# Patient Record
Sex: Male | Born: 1955 | Race: White | Hispanic: No | Smoking: Never smoker
Health system: Southern US, Community
[De-identification: ages and names within clinical notes are randomized; demographics above are authoritative.]

## PROBLEM LIST (undated history)

## (undated) DIAGNOSIS — K766 Portal hypertension: Secondary | ICD-10-CM

## (undated) DIAGNOSIS — K5790 Diverticulosis of intestine, part unspecified, without perforation or abscess without bleeding: Secondary | ICD-10-CM

## (undated) DIAGNOSIS — F1010 Alcohol abuse, uncomplicated: Secondary | ICD-10-CM

## (undated) DIAGNOSIS — R161 Splenomegaly, not elsewhere classified: Secondary | ICD-10-CM

## (undated) DIAGNOSIS — D649 Anemia, unspecified: Secondary | ICD-10-CM

## (undated) DIAGNOSIS — E871 Hypo-osmolality and hyponatremia: Secondary | ICD-10-CM

## (undated) DIAGNOSIS — K760 Fatty (change of) liver, not elsewhere classified: Secondary | ICD-10-CM

## (undated) DIAGNOSIS — E876 Hypokalemia: Secondary | ICD-10-CM

## (undated) DIAGNOSIS — K4090 Unilateral inguinal hernia, without obstruction or gangrene, not specified as recurrent: Secondary | ICD-10-CM

## (undated) DIAGNOSIS — K7460 Unspecified cirrhosis of liver: Secondary | ICD-10-CM

## (undated) DIAGNOSIS — R17 Unspecified jaundice: Secondary | ICD-10-CM

## (undated) DIAGNOSIS — K8020 Calculus of gallbladder without cholecystitis without obstruction: Secondary | ICD-10-CM

## (undated) DIAGNOSIS — R188 Other ascites: Secondary | ICD-10-CM

## (undated) HISTORY — PX: HERNIA REPAIR: SHX51

---

## 2014-12-28 ENCOUNTER — Inpatient Hospital Stay (HOSPITAL_COMMUNITY)
Admission: EM | Admit: 2014-12-28 | Discharge: 2015-01-04 | DRG: 420 | Disposition: A | Discharge status: Hospice | Payer: Medicaid Other | Attending: Internal Medicine | Admitting: Internal Medicine

## 2014-12-28 ENCOUNTER — Emergency Department (HOSPITAL_COMMUNITY): Payer: Medicaid Other

## 2014-12-28 ENCOUNTER — Encounter (HOSPITAL_COMMUNITY): Payer: Self-pay

## 2014-12-28 DIAGNOSIS — K8020 Calculus of gallbladder without cholecystitis without obstruction: Secondary | ICD-10-CM | POA: Diagnosis present

## 2014-12-28 DIAGNOSIS — E876 Hypokalemia: Secondary | ICD-10-CM

## 2014-12-28 DIAGNOSIS — R161 Splenomegaly, not elsewhere classified: Secondary | ICD-10-CM

## 2014-12-28 DIAGNOSIS — K760 Fatty (change of) liver, not elsewhere classified: Secondary | ICD-10-CM | POA: Diagnosis present

## 2014-12-28 DIAGNOSIS — D689 Coagulation defect, unspecified: Secondary | ICD-10-CM | POA: Diagnosis present

## 2014-12-28 DIAGNOSIS — Z6825 Body mass index (BMI) 25.0-25.9, adult: Secondary | ICD-10-CM

## 2014-12-28 DIAGNOSIS — K766 Portal hypertension: Secondary | ICD-10-CM

## 2014-12-28 DIAGNOSIS — E43 Unspecified severe protein-calorie malnutrition: Secondary | ICD-10-CM | POA: Diagnosis present

## 2014-12-28 DIAGNOSIS — D72829 Elevated white blood cell count, unspecified: Secondary | ICD-10-CM | POA: Diagnosis present

## 2014-12-28 DIAGNOSIS — K7011 Alcoholic hepatitis with ascites: Secondary | ICD-10-CM | POA: Diagnosis present

## 2014-12-28 DIAGNOSIS — R17 Unspecified jaundice: Secondary | ICD-10-CM

## 2014-12-28 DIAGNOSIS — F419 Anxiety disorder, unspecified: Secondary | ICD-10-CM | POA: Diagnosis present

## 2014-12-28 DIAGNOSIS — T380X5A Adverse effect of glucocorticoids and synthetic analogues, initial encounter: Secondary | ICD-10-CM | POA: Diagnosis present

## 2014-12-28 DIAGNOSIS — D649 Anemia, unspecified: Secondary | ICD-10-CM | POA: Diagnosis present

## 2014-12-28 DIAGNOSIS — E871 Hypo-osmolality and hyponatremia: Secondary | ICD-10-CM

## 2014-12-28 DIAGNOSIS — Z66 Do not resuscitate: Secondary | ICD-10-CM | POA: Diagnosis present

## 2014-12-28 DIAGNOSIS — Z515 Encounter for palliative care: Secondary | ICD-10-CM

## 2014-12-28 DIAGNOSIS — K7460 Unspecified cirrhosis of liver: Secondary | ICD-10-CM | POA: Diagnosis present

## 2014-12-28 DIAGNOSIS — K4090 Unilateral inguinal hernia, without obstruction or gangrene, not specified as recurrent: Secondary | ICD-10-CM | POA: Insufficient documentation

## 2014-12-28 DIAGNOSIS — Z87891 Personal history of nicotine dependence: Secondary | ICD-10-CM | POA: Diagnosis not present

## 2014-12-28 DIAGNOSIS — F1010 Alcohol abuse, uncomplicated: Secondary | ICD-10-CM

## 2014-12-28 DIAGNOSIS — K4091 Unilateral inguinal hernia, without obstruction or gangrene, recurrent: Secondary | ICD-10-CM

## 2014-12-28 DIAGNOSIS — K7290 Hepatic failure, unspecified without coma: Secondary | ICD-10-CM | POA: Diagnosis present

## 2014-12-28 DIAGNOSIS — K7031 Alcoholic cirrhosis of liver with ascites: Secondary | ICD-10-CM

## 2014-12-28 DIAGNOSIS — D539 Nutritional anemia, unspecified: Secondary | ICD-10-CM | POA: Diagnosis present

## 2014-12-28 DIAGNOSIS — R748 Abnormal levels of other serum enzymes: Secondary | ICD-10-CM | POA: Diagnosis present

## 2014-12-28 DIAGNOSIS — K5790 Diverticulosis of intestine, part unspecified, without perforation or abscess without bleeding: Secondary | ICD-10-CM

## 2014-12-28 DIAGNOSIS — R188 Other ascites: Secondary | ICD-10-CM

## 2014-12-28 HISTORY — DX: Unilateral inguinal hernia, without obstruction or gangrene, not specified as recurrent: K40.90

## 2014-12-28 HISTORY — DX: Diverticulosis of intestine, part unspecified, without perforation or abscess without bleeding: K57.90

## 2014-12-28 HISTORY — DX: Splenomegaly, not elsewhere classified: R16.1

## 2014-12-28 HISTORY — DX: Unspecified cirrhosis of liver: K74.60

## 2014-12-28 HISTORY — DX: Unspecified jaundice: R17

## 2014-12-28 HISTORY — DX: Calculus of gallbladder without cholecystitis without obstruction: K80.20

## 2014-12-28 HISTORY — DX: Fatty (change of) liver, not elsewhere classified: K76.0

## 2014-12-28 HISTORY — DX: Other disorders of bilirubin metabolism: E80.6

## 2014-12-28 HISTORY — DX: Anemia, unspecified: D64.9

## 2014-12-28 HISTORY — DX: Portal hypertension: K76.6

## 2014-12-28 HISTORY — DX: Hypokalemia: E87.6

## 2014-12-28 HISTORY — DX: Alcohol abuse, uncomplicated: F10.10

## 2014-12-28 HISTORY — DX: Other ascites: R18.8

## 2014-12-28 HISTORY — DX: Hypo-osmolality and hyponatremia: E87.1

## 2014-12-28 LAB — URINALYSIS, ROUTINE W REFLEX MICROSCOPIC
Glucose, UA: 100 mg/dL — AB
Hgb urine dipstick: NEGATIVE
LEUKOCYTES UA: NEGATIVE
NITRITE: POSITIVE — AB
PH: 6.5 (ref 5.0–8.0)
Protein, ur: 30 mg/dL — AB
Specific Gravity, Urine: 1.01 (ref 1.005–1.030)

## 2014-12-28 LAB — HEPATITIS PANEL, ACUTE
HCV Ab: NEGATIVE
HEP B C IGM: NONREACTIVE
Hep A IgM: NONREACTIVE
Hepatitis B Surface Ag: NEGATIVE

## 2014-12-28 LAB — COMPREHENSIVE METABOLIC PANEL
ALBUMIN: 2.4 g/dL — AB (ref 3.5–5.2)
ALK PHOS: 156 U/L — AB (ref 39–117)
ALT: 31 U/L (ref 0–53)
ANION GAP: 14 (ref 5–15)
AST: 153 U/L — ABNORMAL HIGH (ref 0–37)
BILIRUBIN TOTAL: 25.8 mg/dL — AB (ref 0.3–1.2)
BUN: 25 mg/dL — ABNORMAL HIGH (ref 6–23)
CO2: 26 mmol/L (ref 19–32)
CREATININE: 0.8 mg/dL (ref 0.50–1.35)
Calcium: 8.2 mg/dL — ABNORMAL LOW (ref 8.4–10.5)
Chloride: 87 mmol/L — ABNORMAL LOW (ref 96–112)
GFR calc Af Amer: >90 mL/min (ref >90)
Glucose, Bld: 113 mg/dL — ABNORMAL HIGH (ref 70–99)
POTASSIUM: 2.7 mmol/L — AB (ref 3.5–5.1)
Sodium: 127 mmol/L — ABNORMAL LOW (ref 135–145)
Total Protein: 6.7 g/dL (ref 6.0–8.3)

## 2014-12-28 LAB — CBC WITH DIFFERENTIAL/PLATELET
BASOS ABS: 0 10*3/uL (ref 0.0–0.1)
Basophils Relative: 0 % (ref 0–1)
EOS ABS: 0 10*3/uL (ref 0.0–0.7)
Eosinophils Relative: 0 % (ref 0–5)
HEMATOCRIT: 30.4 % — AB (ref 39.0–52.0)
Hemoglobin: 10.7 g/dL — ABNORMAL LOW (ref 13.0–17.0)
Lymphocytes Relative: 8 % — ABNORMAL LOW (ref 12–46)
Lymphs Abs: 1 10*3/uL (ref 0.7–4.0)
MCH: 37.9 pg — ABNORMAL HIGH (ref 26.0–34.0)
MCHC: 35.2 g/dL (ref 30.0–36.0)
MCV: 107.8 fL — ABNORMAL HIGH (ref 78.0–100.0)
Monocytes Absolute: 1 10*3/uL (ref 0.1–1.0)
Monocytes Relative: 9 % (ref 3–12)
NEUTROS ABS: 9.7 10*3/uL — AB (ref 1.7–7.7)
NEUTROS PCT: 83 % — AB (ref 43–77)
Platelets: 209 10*3/uL (ref 150–400)
RBC: 2.82 MIL/uL — ABNORMAL LOW (ref 4.22–5.81)
RDW: 18.1 % — ABNORMAL HIGH (ref 11.5–15.5)
WBC: 11.7 10*3/uL — ABNORMAL HIGH (ref 4.0–10.5)

## 2014-12-28 LAB — AMMONIA
AMMONIA: 59 umol/L — AB (ref 11–32)
Ammonia: 65 umol/L — ABNORMAL HIGH (ref 11–32)

## 2014-12-28 LAB — MAGNESIUM: Magnesium: 1.7 mg/dL (ref 1.5–2.5)

## 2014-12-28 LAB — TSH: TSH: 1.136 u[IU]/mL (ref 0.350–4.500)

## 2014-12-28 LAB — RETICULOCYTES
RBC.: 2.48 MIL/uL — ABNORMAL LOW (ref 4.22–5.81)
Retic Count, Absolute: 81.8 10*3/uL (ref 19.0–186.0)
Retic Ct Pct: 3.3 % — ABNORMAL HIGH (ref 0.4–3.1)

## 2014-12-28 LAB — PROTIME-INR
INR: 1.54 — ABNORMAL HIGH (ref 0.00–1.49)
Prothrombin Time: 18.7 seconds — ABNORMAL HIGH (ref 11.6–15.2)

## 2014-12-28 LAB — URINE MICROSCOPIC-ADD ON

## 2014-12-28 LAB — ETHANOL: Alcohol, Ethyl (B): 129 mg/dL — ABNORMAL HIGH (ref 0–9)

## 2014-12-28 LAB — ACETAMINOPHEN LEVEL: Acetaminophen (Tylenol), Serum: <10 ug/mL — ABNORMAL LOW (ref 10–30)

## 2014-12-28 MED ORDER — ALUM & MAG HYDROXIDE-SIMETH 200-200-20 MG/5ML PO SUSP: 30.0000 mL | Freq: Four times a day (QID) | ORAL | Status: DC | PRN

## 2014-12-28 MED ORDER — PNEUMOCOCCAL VAC POLYVALENT 25 MCG/0.5ML IJ INJ
0.5000 mL | INJECTION | INTRAMUSCULAR | Status: AC
  Administered 2014-12-29: 0.5 mL via INTRAMUSCULAR
  Filled 2014-12-28: qty 0.5

## 2014-12-28 MED ORDER — MORPHINE SULFATE 2 MG/ML IJ SOLN
1.0000 mg | INTRAMUSCULAR | Status: DC | PRN
  Administered 2014-12-31 – 2015-01-04 (×7): 1 mg via INTRAVENOUS
  Filled 2014-12-28 (×7): qty 1

## 2014-12-28 MED ORDER — LACTULOSE 10 GM/15ML PO SOLN
30.0000 g | Freq: Once | ORAL | Status: AC
  Administered 2014-12-28: 30 g via ORAL
  Filled 2014-12-28: qty 60

## 2014-12-28 MED ORDER — ENSURE ENLIVE PO LIQD
237.0000 mL | Freq: Two times a day (BID) | ORAL | Status: DC
  Administered 2014-12-28 – 2015-01-04 (×3): 237 mL via ORAL

## 2014-12-28 MED ORDER — IOHEXOL 300 MG/ML  SOLN
100.0000 mL | Freq: Once | INTRAMUSCULAR | Status: AC | PRN
  Administered 2014-12-28: 100 mL via INTRAVENOUS

## 2014-12-28 MED ORDER — CETYLPYRIDINIUM CHLORIDE 0.05 % MT LIQD
7.0000 mL | Freq: Two times a day (BID) | OROMUCOSAL | Status: DC
  Administered 2014-12-28 – 2015-01-03 (×13): 7 mL via OROMUCOSAL

## 2014-12-28 MED ORDER — POTASSIUM CHLORIDE CRYS ER 20 MEQ PO TBCR
40.0000 meq | EXTENDED_RELEASE_TABLET | Freq: Once | ORAL | Status: AC
  Administered 2014-12-28: 40 meq via ORAL
  Filled 2014-12-28: qty 2

## 2014-12-28 MED ORDER — LORAZEPAM 1 MG PO TABS: 1.0000 mg | ORAL_TABLET | Freq: Four times a day (QID) | ORAL | Status: AC | PRN

## 2014-12-28 MED ORDER — LORAZEPAM 1 MG PO TABS
0.0000 mg | ORAL_TABLET | Freq: Four times a day (QID) | ORAL | Status: AC
  Administered 2014-12-28 – 2014-12-29 (×3): 2 mg via ORAL
  Filled 2014-12-28 (×3): qty 2

## 2014-12-28 MED ORDER — ONDANSETRON HCL 4 MG PO TABS: 4.0000 mg | ORAL_TABLET | Freq: Four times a day (QID) | ORAL | Status: DC | PRN

## 2014-12-28 MED ORDER — LORAZEPAM 1 MG PO TABS: 0.0000 mg | ORAL_TABLET | Freq: Two times a day (BID) | ORAL | Status: AC

## 2014-12-28 MED ORDER — SODIUM CHLORIDE 0.9 % IJ SOLN
3.0000 mL | Freq: Two times a day (BID) | INTRAMUSCULAR | Status: DC
  Administered 2014-12-28 – 2015-01-02 (×5): 3 mL via INTRAVENOUS

## 2014-12-28 MED ORDER — TRAZODONE HCL 50 MG PO TABS
25.0000 mg | ORAL_TABLET | Freq: Every evening | ORAL | Status: DC | PRN
Start: 2014-12-28 — End: 2015-01-04

## 2014-12-28 MED ORDER — ONDANSETRON HCL 4 MG/2ML IJ SOLN
4.0000 mg | Freq: Four times a day (QID) | INTRAMUSCULAR | Status: DC | PRN
  Administered 2015-01-02: 4 mg via INTRAVENOUS
  Filled 2014-12-28: qty 2

## 2014-12-28 MED ORDER — SODIUM CHLORIDE 0.9 % IJ SOLN
INTRAMUSCULAR | Status: AC
  Filled 2014-12-28: qty 1000

## 2014-12-28 MED ORDER — VITAMIN B-1 100 MG PO TABS
100.0000 mg | ORAL_TABLET | Freq: Every day | ORAL | Status: DC
  Administered 2014-12-28 – 2015-01-04 (×3): 100 mg via ORAL
  Filled 2014-12-28 (×3): qty 1

## 2014-12-28 MED ORDER — ADULT MULTIVITAMIN W/MINERALS CH
1.0000 | ORAL_TABLET | Freq: Every day | ORAL | Status: DC
  Administered 2014-12-28 – 2015-01-04 (×4): 1 via ORAL
  Filled 2014-12-28 (×4): qty 1

## 2014-12-28 MED ORDER — FOLIC ACID 1 MG PO TABS
1.0000 mg | ORAL_TABLET | Freq: Every day | ORAL | Status: DC
  Administered 2014-12-28 – 2015-01-04 (×4): 1 mg via ORAL
  Filled 2014-12-28 (×4): qty 1

## 2014-12-28 MED ORDER — THIAMINE HCL 100 MG/ML IJ SOLN
100.0000 mg | Freq: Every day | INTRAMUSCULAR | Status: DC
  Administered 2014-12-29 – 2015-01-03 (×5): 100 mg via INTRAVENOUS
  Filled 2014-12-28 (×5): qty 2

## 2014-12-28 MED ORDER — POTASSIUM CHLORIDE IN NACL 20-0.9 MEQ/L-% IV SOLN
INTRAVENOUS | Status: DC
  Administered 2014-12-28 – 2015-01-01 (×8): via INTRAVENOUS

## 2014-12-28 MED ORDER — LORAZEPAM 2 MG/ML IJ SOLN
1.0000 mg | Freq: Four times a day (QID) | INTRAMUSCULAR | Status: AC | PRN
  Administered 2014-12-28 – 2014-12-30 (×3): 1 mg via INTRAVENOUS
  Filled 2014-12-28 (×3): qty 1

## 2014-12-28 NOTE — ED Notes (Signed)
CRITICAL VALUE ALERT  Critical value received:  Potassium 2.7, Tot Bili 25.8  Date of notification:  12/28/14  Time of notification:  0845  Critical value read back:Yes.    Nurse who received alert:  GM  MD notified (1st page):  [phone]

## 2014-12-28 NOTE — ED Notes (Signed)
Pt lives with sister and she reports pt has beenjaundice since last week.   REPorts 3 weeks ago had n/v and cold symptoms.  Sister also reports confusion today.  Pt reports he drinks  " a moderate amount" daily.  Denies pain.  Reports doesn't have a  Librarian, academicDoctor.

## 2014-12-28 NOTE — H&P (Signed)
[facility] History and Physical  [name] UJW:[id number] DOB: 1955/11/04 DOA: 12/28/2014  Referring physician: Rubin Sloan PCP: No primary care provider on file.   Chief Complaint: jaundice  HPI: Franklin Sloan is a 59 y.o. male with a past medical history that includes EtOH abuse presents to the emergency department from home with the chief complaint of jaundice. Initial evaluation reveals, cirrhosis, cholelithiasis, hepatic steatosis, hyperbilirubinemia anemia, portal hypertension. Information is obtained from the patient. He states he lives with his sister she brought him to the emergency room today for a 2 day history of nausea and dry heaving and jaundice. Associated symptoms include abdominal pain described as intermittent sharp located in the upper right quadrant, decreased by mouth intake and decreased energy. Patient reports he drinks 1/2 of a fifth to a whole fifth of vodka daily for the last 2 years. He reports 4 years prior that he stopped drinking altogether. He reports not noticing his own yellow color. He denies fever chills chest pain palpitations shortness of breath cough headache visual disturbances syncope or near-syncope. He denies dysuria hematuria frequency or urgency he denies diarrhea constipation melena. Workup in the emergency department significant for WBCs 11.7 hemoglobin 10.7 sodium 127 potassium 2.7 chloride 87 BUN 25 INR 1.5 for serum glucose 113. Abdominal ultrasound reveals gallbladder distended with possible sludge and wall thickening, CT of the abdomen pelvis reveals hepatic steatosis and cirrhosis with portal venous hypertension including splenomegaly varices and ascites, not really distended gallbladder with cholelithiasis but no biliary ductal dilation, small and large bowel wall thickening, pulmonary atelectasis. In the emergency department he is afebrile hemodynamically stable and not hypoxic Review of Systems:  10 point review of systems completed and  all systems are negative except as indicated in the history of present illness  Past Medical History  Diagnosis Date  . Cirrhosis   . Hepatic steatosis   . Spleen enlarged   . Jaundice   . Hyperbilirubinemia   . Portal hypertension   . ETOH abuse   . Cholelithiasis   . Anemia   . Hypokalemia   . Hyponatremia   . Ascites   . Diverticulosis   . Inguinal hernia    Past Surgical History  Procedure Laterality Date  . Hernia repair     Social History:  reports that he has never smoked. He does not have any smokeless tobacco history on file. He reports that he drinks alcohol. He reports that he does not use illicit drugs. 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 patient unable to recall family medical history at this time  Prior to Admission medications   Medication Sig Start Date End Date Taking? Authorizing Provider  dextromethorphan-guaiFENesin (ROBITUSSIN-DM) 10-100 MG/5ML liquid Take 30 mLs by mouth every 4 (four) hours as needed for cough.   Yes Historical Provider, MD  Pseudoeph-Doxylamine-DM-APAP (NYQUIL MULTI-SYMPTOM PO) Take 30 mLs by mouth daily.   Yes Historical Provider, MD   Physical Exam: Filed Vitals:   12/28/14 1100 12/28/14 1114 12/28/14 1130 12/28/14 1200  BP:  107/70 109/67 106/53  Pulse: 97 95    Temp:      TempSrc:      Resp:  18    Height:      Weight:      SpO2: 91% 91%  98%    Wt Readings from Last 3 Encounters:  12/28/14 88.451 kg (195  lb)    General:  Appears calm and comfortable Eyes: PERRL, normal lids, lateral scleral icterus ENT: grossly normal hearing, lips & tongue because membranes of his mouth are soft pink slightly dry Neck: no LAD, masses or thyromegaly Cardiovascular: RRR, no m/r/g. No LE edema. Respiratory: CTA bilaterally, no w/r/r. Normal respiratory effort. Abdomen: Distended sluggish bowel sounds nontender to  palpation palpable hepatomegaly Skin: Jaundice Musculoskeletal: grossly normal tone BUE/BLE Psychiatric: grossly normal mood and affect, speech fluent and appropriate Neurologic: grossly non-focal. Speech slow and deliberate but clear           Labs on Admission:  Basic Metabolic Panel:  Recent Labs Lab 12/28/14 0800  NA 127*  K 2.7*  CL 87*  CO2 26  GLUCOSE 113*  BUN 25*  CREATININE 0.80  CALCIUM 8.2*   Liver Function Tests:  Recent Labs Lab 12/28/14 0800  AST 153*  ALT 31  ALKPHOS 156*  BILITOT 25.8*  PROT 6.7  ALBUMIN 2.4*   No results for input(s): LIPASE, AMYLASE in the last 168 hours.  Recent Labs Lab 12/28/14 0800  AMMONIA 59*   CBC:  Recent Labs Lab 12/28/14 0800  WBC 11.7*  NEUTROABS 9.7*  HGB 10.7*  HCT 30.4*  MCV 107.8*  PLT 209   Cardiac Enzymes: No results for input(s): CKTOTAL, CKMB, CKMBINDEX, TROPONINI in the last 168 hours.  BNP (last 3 results) No results for input(s): BNP in the last 8760 hours.  ProBNP (last 3 results) No results for input(s): PROBNP in the last 8760 hours.  CBG: No results for input(s): GLUCAP in the last 168 hours.  Radiological Exams on Admission: Dg Chest 2 View  12/28/2014   CLINICAL DATA:  59 year old male with vomiting and cough for 1 week. Jaundice. Initial encounter.  EXAM: CHEST  2 VIEW  COMPARISON:  None.  FINDINGS: Low lung volumes. Mild crowding of markings at both lung bases. Normal cardiac size and mediastinal contours. Visualized tracheal air column is within normal limits. No pneumothorax, pulmonary edema or pleural effusion. No acute osseous abnormality identified.  IMPRESSION: Low lung volumes, otherwise no acute cardiopulmonary abnormality.   Electronically Signed   By: Odessa FlemingH  Hall M.D.   On: 12/28/2014 09:35   Ct Abdomen Pelvis W Contrast  12/28/2014   CLINICAL DATA:  59 year old male with new onset jaundice since last week. Nausea vomiting and cold like illness symptoms for 3 weeks. Confusion  today. Initial encounter.  EXAM: CT ABDOMEN AND PELVIS WITH CONTRAST  TECHNIQUE: Multidetector CT imaging of the abdomen and pelvis was performed using the standard protocol following bolus administration of intravenous contrast.  CONTRAST:  100mL OMNIPAQUE IOHEXOL 300 MG/ML  SOLN  COMPARISON:  Abdomen ultrasound 0857 hours today. Chest radiographs 0918 hours today.  FINDINGS: Atelectasis at both lung bases. Mostly calcified granuloma in the right costophrenic angle. No pericardial or pleural effusion.  No acute osseous abnormality identified.  Small to moderate volume of pelvic ascites. Small fat containing inguinal hernia with trace fluid.  Space of Retzius varices. Otherwise negative bladder. Negative distal colon.  Sigmoid and left colon diverticulosis. Mild wall thickening of the 8 transverse colon and right colon probably is reactive. Normal appendix. No dilated small bowel, but there are thickened loops of small bowel in the left abdomen (series 2, image 73). Negative stomach. Mild duodenum all thickening and small second portion duodenum diverticulum on series 2, image 47.  Splenomegaly, splenic volume calculated at 1,206 mL (normal splenic volume range 83 - 412 mL). Small to  medium caliber varices at the root of the mesentery and tracking within the small bowel mesentery. Recannulized paraumbilical vein and small caliber are ventral abdominal wall varices. Small volume abdominal ascites.  Negative pancreas and adrenal glands. Renal enhancement and contrast excretion within normal limits. Aortoiliac calcified atherosclerosis noted. Major arterial structures are patent.  Portal venous system is patent. Moderately decreased density throughout the liver with irregular liver contour. No discrete liver mass. Moderately distended gallbladder with dependent gallstones. No dilatation of the CBD. No dilated intrahepatic biliary tree.  No lymphadenopathy. Small volume of ascites along the distal thoracic esophagus.   IMPRESSION: 1. Hepatic steatosis and cirrhosis with portal venous hypertension including splenomegaly, varices, ascites. No discrete liver mass is identified. 2. Moderately distended gallbladder with cholelithiasis, but no intra- or extrahepatic biliary ductal dilatation. 3. Reactive appearing small and large bowel wall thickening. 4. Pulmonary atelectasis.   Electronically Signed   By: Odessa Fleming M.D.   On: 12/28/2014 11:23   US Abdomen Limited  12/28/2014   CLINICAL DATA:  Jaundice, right upper quadrant discomfort for the past 3 weeks; history of alcohol abuse, nausea, vomiting, and confusion.  EXAM: US ABDOMEN LIMITED - RIGHT UPPER QUADRANT  COMPARISON:  None  FINDINGS: Gallbladder:  The gallbladder is distended and contains echogenic material likely inspissated bile or sludge. The gallbladder wall is thickened to 7 mm. There is no intramural fluid or positive sonographic Murphy's sign. There is adjacent ascites.  Common bile duct:  Diameter: 5 mm  Liver:  There is marked attenuation of the ultrasound beam by the hepatic echotexture. There is no discrete mass or ductal dilation but evaluation is limited. No focal lesion identified. Within normal limits in parenchymal echogenicity.  IMPRESSION: 1. The gallbladder is mildly distended and the lumen largely filled with echogenic material which may reflect sludge or echogenic bile. There is gallbladder wall thickening. No discrete gallbladder mass is demonstrated but an intraluminal mass could mimic the findings here. 2. Increased hepatic echotexture with poor definition of the parenchymal architecture. 3. CT scanning of the abdomen is recommended for further evaluation of the liver, gallbladder, and pancreas.   Electronically Signed   By: David  [place] M.D.   On: 12/28/2014 09:20    EKG: Reviewed. Sinus rhythm with prolonged QT  Assessment/Plan Principal Problem:   Cirrhosis; likely related to EtOH abuse. Confirmed with abdominal CT. Total bilirubin 25. Will  admit to telemetry. Ammonia level slightly elevated will give 1 dose of lactulose. I'll request gastroenterology assistance. Active Problems:   ETOH abuse: No signs of withdrawal at this time. Will implement CIWA protocol. Will check folate and B-12.  TSH   Cholelithiasis; will request gastroenterology consult. Don't feel general surgery consult needed at this point.    Ascites: Small amount per CT. Related to #1. Monitor    Hyponatremia: Clearly related to #1 in the setting of decreased by mouth intake nausea and vomiting. Will provide gentle IV fluids. Recheck in the morning    Hypokalemia: Related to #1 in the setting of nausea vomiting and decreased by mouth intake. Will replete and recheck. Will obtain a magnesium level as well    Anemia: Likely related to #2. Will obtain an anemia panel. Check FOBT     Hyperbilirubinemia: See #1    Portal hypertension: See #1    Elevated alkaline phosphatase level: see #1. Will monitor   gastroenterology  Code Status: full DVT Prophylaxis: Family Communication: sister on phone  Disposition Plan: home when ready  Time spent:  75 minutes  [facility] Pager [phone]

## 2014-12-28 NOTE — ED Provider Notes (Signed)
CSN: [id number]     Arrival date & time 12/28/14  0740 History   First MD Initiated Contact with Patient 12/28/14 [phone]     Chief Complaint  Patient presents with  . Jaundice     (Consider location/radiation/quality/duration/timing/severity/associated sxs/prior Treatment) The history is provided by the patient.   patient presents with jaundice. Reportedly has been jaundiced for about a week. Patient states he did not notice it. Patient's sister reportedly noticed and told him he had to come to the hospital. About 3 weeks ago he had some nausea and vomiting with abdominal pain. Now he states he has a little bit of a cough. Minimal sputum production. No fevers. States he drinks about two 5 ounce vodkas a day. Denies blood transfusion. No bleeding. No chills. No noticeable weight loss. Patient denies confusion but reportedly patient's sister states that he has had the change this week Past Medical History  Diagnosis Date  . Cirrhosis   . Hepatic steatosis   . Spleen enlarged   . Jaundice   . Hyperbilirubinemia   . Portal hypertension   . ETOH abuse   . Cholelithiasis   . Anemia   . Hypokalemia   . Hyponatremia   . Ascites   . Diverticulosis   . Inguinal hernia    Past Surgical History  Procedure Laterality Date  . Hernia repair     No family history on file. History  Substance Use Topics  . Smoking status: Never Smoker   . Smokeless tobacco: Not on file  . Alcohol Use: Yes     Comment: daily- moderate amount    Review of Systems  Constitutional: Positive for appetite change and fatigue. Negative for unexpected weight change.  Respiratory: Positive for cough. Negative for shortness of breath.   Cardiovascular: Negative for chest pain.  Gastrointestinal: Negative for abdominal pain and abdominal distention.  Genitourinary: Negative for flank pain.  Musculoskeletal: Negative for back pain.  Skin: Positive for color change.  Neurological: Negative for speech difficulty.       Allergies  Review of patient's allergies indicates no known allergies.  Home Medications   Prior to Admission medications   Medication Sig Start Date End Date Taking? Authorizing Provider  dextromethorphan-guaiFENesin (ROBITUSSIN-DM) 10-100 MG/5ML liquid Take 30 mLs by mouth every 4 (four) hours as needed for cough.   Yes Historical Provider, MD  Pseudoeph-Doxylamine-DM-APAP (NYQUIL MULTI-SYMPTOM PO) Take 30 mLs by mouth daily.   Yes Historical Provider, MD   BP 124/72 mmHg  Pulse 95  Temp(Src) 97.7 F (36.5 C) (Oral)  Resp 18  Ht 5\' 11"  (1.803 m)  Wt 190 lb 8 oz (86.41 kg)  BMI 26.58 kg/m2  SpO2 94% Physical Exam  Constitutional: He appears well-developed and well-nourished.  HENT:  Head: Atraumatic.  Eyes: Scleral icterus is present.  Cardiovascular: Regular rhythm.   Pulmonary/Chest: Effort normal and breath sounds normal.  Abdominal: He exhibits mass.  Possible mild ascites. Palpable hepatomegaly. No tenderness.  Musculoskeletal: Normal range of motion.  Neurological: He is alert.  Skin: Skin is warm.  Patient is diffusely jaundiced    ED Course  Procedures (including critical care time) Labs Review Labs Reviewed  CBC WITH DIFFERENTIAL/PLATELET - Abnormal; Notable for the following:    WBC 11.7 (*)    RBC 2.82 (*)    Hemoglobin 10.7 (*)    HCT 30.4 (*)    MCV 107.8 (*)    MCH 37.9 (*)    RDW 18.1 (*)    Neutrophils Relative %  83 (*)    Neutro Abs 9.7 (*)    Lymphocytes Relative 8 (*)    All other components within normal limits  COMPREHENSIVE METABOLIC PANEL - Abnormal; Notable for the following:    Sodium 127 (*)    Potassium 2.7 (*)    Chloride 87 (*)    Glucose, Bld 113 (*)    BUN 25 (*)    Calcium 8.2 (*)    Albumin 2.4 (*)    AST 153 (*)    Alkaline Phosphatase 156 (*)    Total Bilirubin 25.8 (*)    All other components within normal limits  URINALYSIS, ROUTINE W REFLEX MICROSCOPIC - Abnormal; Notable for the following:    Color, Urine  BROWN (*)    APPearance TURBID (*)    Glucose, UA 100 (*)    Bilirubin Urine LARGE (*)    Ketones, ur TRACE (*)    Protein, ur 30 (*)    Urobilinogen, UA >8.0 (*)    Nitrite POSITIVE (*)    All other components within normal limits  PROTIME-INR - Abnormal; Notable for the following:    Prothrombin Time 18.7 (*)    INR 1.54 (*)    All other components within normal limits  AMMONIA - Abnormal; Notable for the following:    Ammonia 59 (*)    All other components within normal limits  ETHANOL - Abnormal; Notable for the following:    Alcohol, Ethyl (B) 129 (*)    All other components within normal limits  AMMONIA - Abnormal; Notable for the following:    Ammonia 65 (*)    All other components within normal limits  RETICULOCYTES - Abnormal; Notable for the following:    Retic Ct Pct 3.3 (*)    RBC. 2.48 (*)    All other components within normal limits  ACETAMINOPHEN LEVEL - Abnormal; Notable for the following:    Acetaminophen (Tylenol), Serum <10.0 (*)    All other components within normal limits  URINE MICROSCOPIC-ADD ON  MAGNESIUM  HEPATITIS PANEL, ACUTE  OCCULT BLOOD X 1 CARD TO LAB, STOOL  VITAMIN B12  FOLATE  IRON AND TIBC  FERRITIN  URINE RAPID DRUG SCREEN (HOSP PERFORMED)  TSH    Imaging Review Dg Chest 2 View  12/28/2014   CLINICAL DATA:  59 year old male with vomiting and cough for 1 week. Jaundice. Initial encounter.  EXAM: CHEST  2 VIEW  COMPARISON:  None.  FINDINGS: Low lung volumes. Mild crowding of markings at both lung bases. Normal cardiac size and mediastinal contours. Visualized tracheal air column is within normal limits. No pneumothorax, pulmonary edema or pleural effusion. No acute osseous abnormality identified.  IMPRESSION: Low lung volumes, otherwise no acute cardiopulmonary abnormality.   Electronically Signed   By: Odessa Fleming M.D.   On: 12/28/2014 09:35   Ct Abdomen Pelvis W Contrast  12/28/2014   CLINICAL DATA:  59 year old male with new onset  jaundice since last week. Nausea vomiting and cold like illness symptoms for 3 weeks. Confusion today. Initial encounter.  EXAM: CT ABDOMEN AND PELVIS WITH CONTRAST  TECHNIQUE: Multidetector CT imaging of the abdomen and pelvis was performed using the standard protocol following bolus administration of intravenous contrast.  CONTRAST:  OMNIPAQUE IOHEXOL 300 MG/ML  SOLN  COMPARISON:  Abdomen ultrasound 0857 hours today. Chest radiographs 0918 hours today.  FINDINGS: Atelectasis at both lung bases. Mostly calcified granuloma in the right costophrenic angle. No pericardial or pleural effusion.  No acute osseous abnormality identified.  Small to moderate volume of pelvic ascites. Small fat containing inguinal hernia with trace fluid.  Space of Retzius varices. Otherwise negative bladder. Negative distal colon.  Sigmoid and left colon diverticulosis. Mild wall thickening of the 8 transverse colon and right colon probably is reactive. Normal appendix. No dilated small bowel, but there are thickened loops of small bowel in the left abdomen (series 2, image 73). Negative stomach. Mild duodenum all thickening and small second portion duodenum diverticulum on series 2, image 47.  Splenomegaly, splenic volume calculated at 1,206 mL (normal splenic volume range 83 - 412 mL). Small to medium caliber varices at the root of the mesentery and tracking within the small bowel mesentery. Recannulized paraumbilical vein and small caliber are ventral abdominal wall varices. Small volume abdominal ascites.  Negative pancreas and adrenal glands. Renal enhancement and contrast excretion within normal limits. Aortoiliac calcified atherosclerosis noted. Major arterial structures are patent.  Portal venous system is patent. Moderately decreased density throughout the liver with irregular liver contour. No discrete liver mass. Moderately distended gallbladder with dependent gallstones. No dilatation of the CBD. No dilated intrahepatic  biliary tree.  No lymphadenopathy. Small volume of ascites along the distal thoracic esophagus.  IMPRESSION: 1. Hepatic steatosis and cirrhosis with portal venous hypertension including splenomegaly, varices, ascites. No discrete liver mass is identified. 2. Moderately distended gallbladder with cholelithiasis, but no intra- or extrahepatic biliary ductal dilatation. 3. Reactive appearing small and large bowel wall thickening. 4. Pulmonary atelectasis.   Electronically Signed   By: Odessa Fleming M.D.   On: 12/28/2014 11:23   US Abdomen Limited  12/28/2014   CLINICAL DATA:  Jaundice, right upper quadrant discomfort for the past 3 weeks; history of alcohol abuse, nausea, vomiting, and confusion.  EXAM: US ABDOMEN LIMITED - RIGHT UPPER QUADRANT  COMPARISON:  None  FINDINGS: Gallbladder:  The gallbladder is distended and contains echogenic material likely inspissated bile or sludge. The gallbladder wall is thickened to 7 mm. There is no intramural fluid or positive sonographic Murphy's sign. There is adjacent ascites.  Common bile duct:  Diameter: 5 mm  Liver:  There is marked attenuation of the ultrasound beam by the hepatic echotexture. There is no discrete mass or ductal dilation but evaluation is limited. No focal lesion identified. Within normal limits in parenchymal echogenicity.  IMPRESSION: 1. The gallbladder is mildly distended and the lumen largely filled with echogenic material which may reflect sludge or echogenic bile. There is gallbladder wall thickening. No discrete gallbladder mass is demonstrated but an intraluminal mass could mimic the findings here. 2. Increased hepatic echotexture with poor definition of the parenchymal architecture. 3. CT scanning of the abdomen is recommended for further evaluation of the liver, gallbladder, and pancreas.   Electronically Signed   By: David  [place] M.D.   On: 12/28/2014 09:20     EKG Interpretation   Date/Time:  Wednesday December 28 2014 08:06:17 EDT Ventricular  Rate:  99 PR Interval:  174 QRS Duration: 84 QT Interval:  392 QTC Calculation: 503 R Axis:   -5 Text Interpretation:  Sinus rhythm Low voltage, precordial leads  Borderline abnrm T, anterolateral leads Prolonged QT interval Confirmed by  Rubin Payor  MD, Jennilee Demarco [id number]) on 12/28/2014 8:09:46 AM      MDM   Final diagnoses:  Cirrhosis of liver with ascites, unspecified hepatic cirrhosis type  Jaundice    Patient with cirrhosis and ascites. Has jaundice. Could be related alcohol but also some abnormal findings on the gallbladder. Does not have  primary care follow-up. Will admit to internal medicine.    Benjiman Core, MD 12/28/14 [phone]

## 2014-12-28 NOTE — ED Notes (Signed)
Pt's skin jaundiced upon observation, sclera of eyes bright yellow, pt alert but orientation is questionable at times per pt's sister, abdomen has slight distention without pain on palpation.

## 2014-12-29 ENCOUNTER — Encounter (HOSPITAL_COMMUNITY): Payer: Self-pay | Admitting: Gastroenterology

## 2014-12-29 DIAGNOSIS — F1010 Alcohol abuse, uncomplicated: Secondary | ICD-10-CM

## 2014-12-29 DIAGNOSIS — R17 Unspecified jaundice: Secondary | ICD-10-CM

## 2014-12-29 DIAGNOSIS — K7031 Alcoholic cirrhosis of liver with ascites: Secondary | ICD-10-CM

## 2014-12-29 DIAGNOSIS — D649 Anemia, unspecified: Secondary | ICD-10-CM

## 2014-12-29 LAB — COMPREHENSIVE METABOLIC PANEL
ALBUMIN: 2 g/dL — AB (ref 3.5–5.2)
ALT: 28 U/L (ref 0–53)
AST: 156 U/L — AB (ref 0–37)
Alkaline Phosphatase: 136 U/L — ABNORMAL HIGH (ref 39–117)
Anion gap: 9 (ref 5–15)
BILIRUBIN TOTAL: 25.5 mg/dL — AB (ref 0.3–1.2)
BUN: 23 mg/dL (ref 6–23)
CALCIUM: 8 mg/dL — AB (ref 8.4–10.5)
CHLORIDE: 94 mmol/L — AB (ref 96–112)
CO2: 28 mmol/L (ref 19–32)
Creatinine, Ser: 0.42 mg/dL — ABNORMAL LOW (ref 0.50–1.35)
GFR calc Af Amer: >90 mL/min (ref >90)
GFR calc non Af Amer: >90 mL/min (ref >90)
Glucose, Bld: 94 mg/dL (ref 70–99)
Potassium: 2.8 mmol/L — ABNORMAL LOW (ref 3.5–5.1)
Sodium: 131 mmol/L — ABNORMAL LOW (ref 135–145)
Total Protein: 5.7 g/dL — ABNORMAL LOW (ref 6.0–8.3)

## 2014-12-29 LAB — PROTIME-INR
INR: 1.82 — ABNORMAL HIGH (ref 0.00–1.49)
Prothrombin Time: 21.2 seconds — ABNORMAL HIGH (ref 11.6–15.2)

## 2014-12-29 LAB — FOLATE: Folate: 0.8 ng/mL — ABNORMAL LOW

## 2014-12-29 LAB — CBC
HCT: 27 % — ABNORMAL LOW (ref 39.0–52.0)
Hemoglobin: 9.5 g/dL — ABNORMAL LOW (ref 13.0–17.0)
MCH: 38.3 pg — ABNORMAL HIGH (ref 26.0–34.0)
MCHC: 35.2 g/dL (ref 30.0–36.0)
MCV: 108.9 fL — ABNORMAL HIGH (ref 78.0–100.0)
Platelets: 168 10*3/uL (ref 150–400)
RBC: 2.48 MIL/uL — AB (ref 4.22–5.81)
RDW: 18.2 % — AB (ref 11.5–15.5)
WBC: 8.9 10*3/uL (ref 4.0–10.5)

## 2014-12-29 LAB — IRON AND TIBC: IRON: 92 ug/dL (ref 42–165)

## 2014-12-29 LAB — RAPID URINE DRUG SCREEN, HOSP PERFORMED
AMPHETAMINES: NOT DETECTED
BARBITURATES: NOT DETECTED
Benzodiazepines: NOT DETECTED
COCAINE: NOT DETECTED
Opiates: POSITIVE — AB
TETRAHYDROCANNABINOL: NOT DETECTED

## 2014-12-29 LAB — AMMONIA: AMMONIA: 64 umol/L — AB (ref 11–32)

## 2014-12-29 LAB — VITAMIN B12: Vitamin B-12: 805 pg/mL (ref 211–911)

## 2014-12-29 LAB — FERRITIN: Ferritin: 2258 ng/mL — ABNORMAL HIGH (ref 22–322)

## 2014-12-29 MED ORDER — MAGNESIUM SULFATE 2 GM/50ML IV SOLN
2.0000 g | Freq: Once | INTRAVENOUS | Status: AC
  Administered 2014-12-29: 2 g via INTRAVENOUS
  Filled 2014-12-29: qty 50

## 2014-12-29 MED ORDER — LACTULOSE ENEMA
300.0000 mL | Freq: Two times a day (BID) | ORAL | Status: DC
  Administered 2014-12-29 – 2015-01-02 (×10): 300 mL via RECTAL
  Filled 2014-12-29 (×14): qty 300

## 2014-12-29 MED ORDER — VITAMIN K1 10 MG/ML IJ SOLN
10.0000 mg | Freq: Once | INTRAMUSCULAR | Status: AC
  Administered 2014-12-29: 10 mg via SUBCUTANEOUS
  Filled 2014-12-29: qty 1

## 2014-12-29 MED ORDER — LORAZEPAM 2 MG/ML IJ SOLN
1.0000 mg | INTRAMUSCULAR | Status: DC
  Administered 2014-12-29 – 2014-12-30 (×5): 1 mg via INTRAVENOUS
  Filled 2014-12-29 (×5): qty 1

## 2014-12-29 MED ORDER — POTASSIUM CHLORIDE 10 MEQ/100ML IV SOLN
10.0000 meq | INTRAVENOUS | Status: AC
  Administered 2014-12-29 (×4): 10 meq via INTRAVENOUS
  Filled 2014-12-29: qty 100

## 2014-12-29 MED ORDER — PANTOPRAZOLE SODIUM 40 MG IV SOLR
40.0000 mg | INTRAVENOUS | Status: DC
  Administered 2014-12-29 – 2015-01-04 (×7): 40 mg via INTRAVENOUS
  Filled 2014-12-29 (×7): qty 40

## 2014-12-29 NOTE — Progress Notes (Signed)
INITIAL NUTRITION ASSESSMENT   INTERVENTION: -Agree with Ensure Enlive BID when pt diet is advanced to full liquids  -Add ProStat 30 ml TID (each 30 ml provides 100 kcal, 15 gr protein)   -RD will continue to follow  NUTRITION DIAGNOSIS: Inadequate oral intake related to cirrhosis as evidenced by NPO status  Goal: Pt to progress diet to meet >/= 90% of their estimated nutrition needs    Monitor:  Diet advancement and tolerance-lab results   Reason for Assessment: Malnutrition Screen   59 y.o. male  Admitting Dx: Cirrhosis  ASSESSMENT: Pt presents with jaundice. Hx of ETOH abuse and decreased oral intake prior to admission. He is not alert enough at this time to provide hx. CT with evidence of cirrhosis, splenomegaly, varices, and ascites (perihepatic). Labs: sodium-131 and  potassium 2.8- low Nutrition focused exam: deferred   Height: Ht Readings from Last 1 Encounters:  12/28/14 5\' 11"  (1.803 m)    Weight: Wt Readings from Last 1 Encounters:  12/29/14 182 lb 3.2 oz (82.645 kg)    Ideal Body Weight: 172# (78 kg)  % Ideal Body Weight: 106%  Wt Readings from Last 10 Encounters:  12/29/14 182 lb 3.2 oz (82.645 kg)    Usual Body Weight: unknown  BMI:  Body mass index is 25.42 kg/(m^2). overweight  Estimated Nutritional Needs: Kcal: [id number] Protein: 107-123 gr Fluid: 2.0-2.3 liters daily  Skin: jaundice   Diet Order: Diet NPO time specified  EDUCATION NEEDS: -No education needs identified at this time   Intake/Output Summary (Last 24 hours) at 12/29/14 1535 Last data filed at 12/29/14 0900  Gross per 24 hour  Intake      0 ml  Output      0 ml  Net      0 ml    Last BM: 12/28/14 -abdomen distended and pt is receiving lactulose enemas  Labs:   Recent Labs Lab 12/28/14 0800 12/28/14 1414 12/29/14 0613  NA 127*  --  131*  K 2.7*  --  2.8*  CL 87*  --  94*  CO2 26  --  28  BUN 25*  --  23  CREATININE 0.80  --  0.42*  CALCIUM 8.2*  --   8.0*  MG  --  1.7  --   GLUCOSE 113*  --  94    CBG (last 3)  No results for input(s): GLUCAP in the last 72 hours.  Scheduled Meds: . antiseptic oral rinse  7 mL Mouth Rinse BID  . feeding supplement (ENSURE ENLIVE)  237 mL Oral BID BM  . folic acid  1 mg Oral Daily  . lactulose  300 mL Rectal BID  . LORazepam  1 mg Intravenous Q4H  . LORazepam  0-4 mg Oral Q6H   Followed by  . [START ON 12/30/2014] LORazepam  0-4 mg Oral Q12H  . multivitamin with minerals  1 tablet Oral Daily  . pantoprazole (PROTONIX) IV  40 mg Intravenous Q24H  . potassium chloride  10 mEq Intravenous Q1 Hr x 4  . sodium chloride  3 mL Intravenous Q12H  . thiamine  100 mg Oral Daily   Or  . thiamine  100 mg Intravenous Daily    Continuous Infusions: . 0.9 % NaCl with KCl 20 mEq / L 75 mL/hr at 12/29/14 0240    Past Medical History  Diagnosis Date  . Cirrhosis   . Hepatic steatosis   . Spleen enlarged   . Jaundice   . Hyperbilirubinemia   .  Portal hypertension   . ETOH abuse   . Cholelithiasis   . Anemia   . Hypokalemia   . Hyponatremia   . Ascites   . Diverticulosis   . Inguinal hernia     Past Surgical History  Procedure Laterality Date  . Hernia repair      Franklin Shivers MS,RD,CSG,LDN Office: [phone] Pager: [phone]

## 2014-12-29 NOTE — Progress Notes (Signed)
Patient bed alarm going off at 2100.  Patient is attempting to get out of bed without assistance.  Patient assisted to bedside commode and was noted to have an unsteady gait.  While sitting on the bedside commode it was noted to have tremors, visual hallucinations and confusion.  Patient assisted back to bed and CIWA performed which gave him a score of 17.  Bed alarm was engaged and while I was attempting to get his ativan his bed alarm was going off again.  Patient was attempting to get out of bed stating it was time for him to get dressed.  I attempted to reorient the patient and was unsuccessful however he did allow me to assist him back to bed.  A nurse Control and instrumentation engineertech safety sitter was initiated for patient safety to prevent injury.  Nursing staff to continue to monitor.

## 2014-12-29 NOTE — Care Management Utilization Note (Signed)
UR completed 

## 2014-12-29 NOTE — Consult Note (Addendum)
Referring Provider: [name], NP Primary Care Physician:  No primary care provider on file. Primary Gastroenterologist:  Dr. Jena Gauss   Date of Admission: 12/28/14 Date of Consultation: 12/29/14  Reason for Consultation:  Elevated LFTs   HPI:  Franklin Sloan is a 59 year old male with history of ETOH abuse, presenting to the ED from home with complaints of jaundice. At time of consultation, he does not arouse to verbal stimuli. Moans with sternal rub and painful stimuli. Discussed with nursing staff, who state he received Ativan overnight. Per medication administration record, he last receive Ativan 2 mg at 0240. All information gathered from medical record, as patient was not able to assist in history.   Per documentation, patient lives with his sister and was taken to the ED after 2 day history of N/V and noted to be jaundice. Associated symptoms per documentation included RUQ pain, decrease oral intake, and fatigue. Patient is documented as drinking 12/ of a fifth to a whole fifth of vodka daily for the last 2 years. No documentation of fever or chills, overt GI bleeding. US abdomen with gallbladder wall thickening, mild distension of gallbladder with sludge or bile. CT with evidence of cirrhosis, splenomegaly, varices, and ascites. Moderately distended gallbladder with gallstones but NO ductal dilatation. Reactive small and large bowel wall thickening. Mild leukocytosis on admission of 11.7, now resolved today. Afebrile. Discussed with Dr. Tyron Russell regarding amount of ascites seen on [place] and CT: no large volume ascites present but perihepatic ascites noted. If fluid analysis needed, could obtain small syringe size of fluid, as there is not a significant amount present. Alcohol level 129 on admission. Viral markers negative.   Spoke with Olene Floss via cell phone (sister) for more information. States 3 weeks ago acute onset of N/V that lasted 2-3 days. Jaundice started around this time. Blood in  urine, no overt signs of GI bleeding. No prior colonoscopy or EGD.   Discriminant function: 41-58 with PT controls. MELD 24.   Past Medical History  Diagnosis Date  . Cirrhosis   . Hepatic steatosis   . Spleen enlarged   . Jaundice   . Hyperbilirubinemia   . Portal hypertension   . ETOH abuse   . Cholelithiasis   . Anemia   . Hypokalemia   . Hyponatremia   . Ascites   . Diverticulosis   . Inguinal hernia     Past Surgical History  Procedure Laterality Date  . Hernia repair      Prior to Admission medications   Medication Sig Start Date End Date Taking? Authorizing Provider  dextromethorphan-guaiFENesin (ROBITUSSIN-DM) 10-100 MG/5ML liquid Take 30 mLs by mouth every 4 (four) hours as needed for cough.   Yes Historical Provider, MD  Pseudoeph-Doxylamine-DM-APAP (NYQUIL MULTI-SYMPTOM PO) Take 30 mLs by mouth daily.   Yes Historical Provider, MD    Current Facility-Administered Medications  Medication Dose Route Frequency Provider Last Rate Last Dose  . 0.9 % NaCl with KCl 20 mEq/ L  infusion   Intravenous Continuous Gwenyth Bender, NP 75 mL/hr at 12/29/14 0240    . alum & mag hydroxide-simeth (MAALOX/MYLANTA) 200-200-20 MG/5ML suspension 30 mL  30 mL Oral Q6H PRN Gwenyth Bender, NP      . antiseptic oral rinse (CPC / CETYLPYRIDINIUM CHLORIDE 0.05%) solution 7 mL  7 mL Mouth Rinse BID Maryann Mikhail, DO   7 mL at 12/28/14 2127  . feeding supplement (ENSURE ENLIVE) (ENSURE ENLIVE) liquid 237 mL  237 mL Oral BID BM Maryann  Mikhail, DO   237 mL at 12/28/14 1530  . folic acid (FOLVITE) tablet 1 mg  1 mg Oral Daily Gwenyth Bender, NP   1 mg at 12/28/14 1509  . LORazepam (ATIVAN) tablet 1 mg  1 mg Oral Q6H PRN Gwenyth Bender, NP       Or  . LORazepam (ATIVAN) injection 1 mg  1 mg Intravenous Q6H PRN Gwenyth Bender, NP   1 mg at 12/28/14 1823  . LORazepam (ATIVAN) injection 1 mg  1 mg Intravenous Q4H Maryann Mikhail, DO      . LORazepam (ATIVAN) tablet 0-4 mg  0-4 mg Oral Q6H Gwenyth Bender, NP   2 mg at 12/29/14 0240   Followed by  . [START ON 12/30/2014] LORazepam (ATIVAN) tablet 0-4 mg  0-4 mg Oral Q12H Lesle Chris Black, NP      . morphine 2 MG/ML injection 1 mg  1 mg Intravenous Q4H PRN Gwenyth Bender, NP      . multivitamin with minerals tablet 1 tablet  1 tablet Oral Daily Gwenyth Bender, NP   1 tablet at 12/28/14 1506  . ondansetron (ZOFRAN) tablet 4 mg  4 mg Oral Q6H PRN Gwenyth Bender, NP       Or  . ondansetron [facility]) injection 4 mg  4 mg Intravenous Q6H PRN Gwenyth Bender, NP      . pneumococcal 23 valent vaccine (PNU-IMMUNE) injection 0.5 mL  0.5 mL Intramuscular Tomorrow-1000 Maryann Mikhail, DO      . sodium chloride 0.9 % injection 3 mL  3 mL Intravenous Q12H Lesle Chris Black, NP   3 mL at 12/28/14 1400  . thiamine (VITAMIN B-1) tablet 100 mg  100 mg Oral Daily Lesle Chris Black, NP   100 mg at 12/28/14 1509   Or  . thiamine (B-1) injection 100 mg  100 mg Intravenous Daily Lesle Chris Black, NP      . traZODone (DESYREL) tablet 25 mg  25 mg Oral QHS PRN Gwenyth Bender, NP        Allergies as of 12/28/2014  . (No Known Allergies)    Family History  Problem Relation Age of Onset  . Colon cancer Neg Hx      History   Social History  . Marital Status: Legally Separated    Spouse Name: N/A  . Number of Children: N/A  . Years of Education: N/A   Occupational History  . Not on file.   Social History Main Topics  . Smoking status: Never Smoker   . Smokeless tobacco: Not on file  . Alcohol Use: Yes     Comment: daily- moderate amount  . Drug Use: No  . Sexual Activity: Not on file   Other Topics Concern  . Not on file   Social History Narrative    Review of Systems: Unable to obtain due to patient status.    Physical Exam: Vital signs in last 24 hours: Temp:  [97.7 F (36.5 C)-98.6 F (37 C)] 98.6 F (37 C) (04/21 0650) Pulse Rate:  [92-118] 118 (04/21 0650) Resp:  [16-18] 18 (04/21 0650) BP: (104-132)/(49-72) 111/63 mmHg (04/21 0650) SpO2:  [90  %-98 %] 95 % (04/21 0650) Weight:  [182 lb 3.2 oz (82.645 kg)-190 lb 8 oz (86.41 kg)] 182 lb 3.2 oz (82.645 kg) (04/21 0650) Last BM Date: 12/28/14 General:   Somnolent, moans to sternal rub Head:  Normocephalic and atraumatic. Eyes:  +scleral icterus Nose:  No deformity, discharge,  or lesions. Lungs:  Clear throughout to auscultation.   Heart:  S1 S2 present, no murmrus Abdomen:  +BS, distended but soft, nontender to deep palpation, +hepatomegaly Rectal:  Deferred until time of colonoscopy.   Msk:  Symmetrical without gross deformities.  Extremities:  Without edema. Neurologic:  Unable to assess Skin:  Jaundiced  Intake/Output from previous day:   Intake/Output this shift:    Lab Results:  Recent Labs  12/28/14 0800 12/29/14 0617  WBC 11.7* 8.9  HGB 10.7* 9.5*  HCT 30.4* 27.0*  PLT 209 168   BMET  Recent Labs  12/28/14 0800 12/29/14 0613  NA 127* 131*  K 2.7* 2.8*  CL 87* 94*  CO2 26 28  GLUCOSE 113* 94  BUN 25* 23  CREATININE 0.80 0.42*  CALCIUM 8.2* 8.0*   LFT  Recent Labs  12/28/14 0800 12/29/14 0613  PROT 6.7 5.7*  ALBUMIN 2.4* 2.0*  AST 153* 156*  ALT 31 28  ALKPHOS 156* 136*  BILITOT 25.8* 25.5*   PT/INR  Recent Labs  12/28/14 0800  LABPROT 18.7*  INR 1.54*   Hepatitis Panel  Recent Labs  12/28/14 0800  HEPBSAG NEGATIVE  HCVAB NEGATIVE  HEPAIGM NON REACTIVE  HEPBIGM NON REACTIVE   Ammonia: 65  Studies/Results: Dg Chest 2 View  12/28/2014   CLINICAL DATA:  59 year old male with vomiting and cough for 1 week. Jaundice. Initial encounter.  EXAM: CHEST  2 VIEW  COMPARISON:  None.  FINDINGS: Low lung volumes. Mild crowding of markings at both lung bases. Normal cardiac size and mediastinal contours. Visualized tracheal air column is within normal limits. No pneumothorax, pulmonary edema or pleural effusion. No acute osseous abnormality identified.  IMPRESSION: Low lung volumes, otherwise no acute cardiopulmonary abnormality.    Electronically Signed   By: Odessa FlemingH  Hall M.D.   On: 12/28/2014 09:35   Ct Abdomen Pelvis W Contrast  12/28/2014   CLINICAL DATA:  59 year old male with new onset jaundice since last week. Nausea vomiting and cold like illness symptoms for 3 weeks. Confusion today. Initial encounter.  EXAM: CT ABDOMEN AND PELVIS WITH CONTRAST  TECHNIQUE: Multidetector CT imaging of the abdomen and pelvis was performed using the standard protocol following bolus administration of intravenous contrast.  CONTRAST:  100mL OMNIPAQUE IOHEXOL 300 MG/ML  SOLN  COMPARISON:  Abdomen ultrasound 0857 hours today. Chest radiographs 0918 hours today.  FINDINGS: Atelectasis at both lung bases. Mostly calcified granuloma in the right costophrenic angle. No pericardial or pleural effusion.  No acute osseous abnormality identified.  Small to moderate volume of pelvic ascites. Small fat containing inguinal hernia with trace fluid.  Space of Retzius varices. Otherwise negative bladder. Negative distal colon.  Sigmoid and left colon diverticulosis. Mild wall thickening of the 8 transverse colon and right colon probably is reactive. Normal appendix. No dilated small bowel, but there are thickened loops of small bowel in the left abdomen (series 2, image 73). Negative stomach. Mild duodenum all thickening and small second portion duodenum diverticulum on series 2, image 47.  Splenomegaly, splenic volume calculated at 1,206 mL (normal splenic volume range 83 - 412 mL). Small to medium caliber varices at the root of the mesentery and tracking within the small bowel mesentery. Recannulized paraumbilical vein and small caliber are ventral abdominal wall varices. Small volume abdominal ascites.  Negative pancreas and adrenal glands. Renal enhancement and contrast excretion within normal limits. Aortoiliac calcified atherosclerosis noted. Major arterial structures are patent.  Portal venous system is  patent. Moderately decreased density throughout the liver with  irregular liver contour. No discrete liver mass. Moderately distended gallbladder with dependent gallstones. No dilatation of the CBD. No dilated intrahepatic biliary tree.  No lymphadenopathy. Small volume of ascites along the distal thoracic esophagus.  IMPRESSION: 1. Hepatic steatosis and cirrhosis with portal venous hypertension including splenomegaly, varices, ascites. No discrete liver mass is identified. 2. Moderately distended gallbladder with cholelithiasis, but no intra- or extrahepatic biliary ductal dilatation. 3. Reactive appearing small and large bowel wall thickening. 4. Pulmonary atelectasis.   Electronically Signed   By: Odessa Fleming M.D.   On: 12/28/2014 11:23   US Abdomen Limited  12/28/2014   CLINICAL DATA:  Jaundice, right upper quadrant discomfort for the past 3 weeks; history of alcohol abuse, nausea, vomiting, and confusion.  EXAM: US ABDOMEN LIMITED - RIGHT UPPER QUADRANT  COMPARISON:  None  FINDINGS: Gallbladder:  The gallbladder is distended and contains echogenic material likely inspissated bile or sludge. The gallbladder wall is thickened to 7 mm. There is no intramural fluid or positive sonographic Murphy's sign. There is adjacent ascites.  Common bile duct:  Diameter: 5 mm  Liver:  There is marked attenuation of the ultrasound beam by the hepatic echotexture. There is no discrete mass or ductal dilation but evaluation is limited. No focal lesion identified. Within normal limits in parenchymal echogenicity.  IMPRESSION: 1. The gallbladder is mildly distended and the lumen largely filled with echogenic material which may reflect sludge or echogenic bile. There is gallbladder wall thickening. No discrete gallbladder mass is demonstrated but an intraluminal mass could mimic the findings here. 2. Increased hepatic echotexture with poor definition of the parenchymal architecture. 3. CT scanning of the abdomen is recommended for further evaluation of the liver, gallbladder, and pancreas.    Electronically Signed   By: David  [place] M.D.   On: 12/28/2014 09:20    Impression: 59 year old male admitted with alcoholic hepatitis, likely underlying ETOH cirrhosis, negative viral markers, no obvious signs/symptoms of infection but significantly altered mental status after receiving 2 mg Ativan earlier this morning. Ammonia mildly elevated at 65. No obvious signs of overt GI bleeding. Unable to obtain complete history from patient, but the majority of information was gathered by talking with his sister and review of the medical records.   Discriminant function: 41-58 with PT controls and MELD 24. 30 day mortality rate high; as viral markers negative and no obvious signs/symptoms of infection, needs prednisolone. However, he is not safe to take in oral medications due to altered mental status. Needs to remain NPO, add lactulose enemas, check INR again today, supportive measures, add IV PPI for GI prophylaxis. Will discuss with Dr. Jena Gauss further management due to inability to tolerate oral medications currently.   Anemia: multifactorial without overt signs of GI bleeding. Will ultimately need outpatient initial EGD for variceal screening and screening colonoscopy.   Plan: PPI IV Lactulose enemas Monitor cognitive status: discussed with RN. Hold on scheduled morning dose of Ativan Check INR today HFP/INR in am Prednisolone 40 mg for 30 days with taper over 2 weeks thereafter once tolerating by mouth Will continue to follow with you   Nira Retort, ANP-BC [facility]     LOS: 1 day    12/29/2014, 10:01 AM  Attending note:  Patient seen and examined. He is more awake but agitated. He now has mitts on. Nursing staff getting ready to administer lactulose enema. He just received another dose of Ativan for increased agitation.  INR up to 1.82. Will give Vtamin k 10 mg sq x 1 dose.

## 2014-12-29 NOTE — Progress Notes (Signed)
[facility]                                                                              Patient Demographics  Franklin Sloan, is a 59 y.o. male, DOB - 07-28-1956, CHY:[id number]  Admit date - 12/28/2014   Admitting Physician Cristal Ford, DO  Outpatient Primary MD for the patient is No primary care provider on file.  LOS - 1   Chief Complaint  Patient presents with  . Jaundice      HPI on 12/29/2014 by Ms. Franklin Carrel, NP Franklin Sloan is a 59 y.o. male with a past medical history that includes EtOH abuse presents to the emergency department from home with the chief complaint of jaundice. Initial evaluation reveals, cirrhosis, cholelithiasis, hepatic steatosis, hyperbilirubinemia anemia, portal hypertension. Information is obtained from the patient. He states he lives with his sister she brought him to the emergency room today for a 2 day history of nausea and dry heaving and jaundice. Associated symptoms include abdominal pain described as intermittent sharp located in the upper right quadrant, decreased by mouth intake and decreased energy. Patient reports he drinks 1/2 of a fifth to a whole fifth of vodka daily for the last 2 years. He reports 4 years prior that he stopped drinking altogether. He reports not noticing his own yellow color. He denies fever chills chest pain palpitations shortness of breath cough headache visual disturbances syncope or near-syncope. He denies dysuria hematuria frequency or urgency he denies diarrhea constipation melena. Workup in the emergency department significant for WBCs 11.7 hemoglobin 10.7 sodium 127 potassium 2.7 chloride 87 BUN 25 INR 1.5 for serum glucose 113. Abdominal ultrasound reveals gallbladder distended with possible sludge and wall thickening, CT of the abdomen pelvis reveals hepatic steatosis and cirrhosis with portal venous hypertension including splenomegaly varices and ascites, not really distended gallbladder with cholelithiasis but no  biliary ductal dilation, small and large bowel wall thickening, pulmonary atelectasis. In the emergency department he is afebrile hemodynamically stable and not hypoxic  Assessment & Plan   Jaundice/alcoholic cirrhosis/hepatitis/portal hypertension -Found on CT of the abdomen -Elevated total bilirubin, alk phos, ammonia -Gastroenterology consulted and aappreciated -Continue lactulose enema, PPI   Alcohol abuse -Alcohol level 129 -Continue CIWA protocol   Ascites  -Secondary to above -Small amount per CT   Hyponatremia -Improved with IVF, will continue to monitor -TSH 1.136  Hypokalemia -Continue to replace -Magnesium 1.7, will replace  Macrocytic Anemia -iron 92, Ferritin 2258 (likely APR) -Continue to monitor CBC -No signs of bleeding -Patient would benefit from EGD/Colonoscopy  Code Status: Full  Family Communication: None at bedside  Disposition Plan: Admitted.  Pending further recommendations from GI  Time Spent in minutes   30 minutes  Procedures  None  Consults   Gastroenterology  DVT Prophylaxis  SCDs  Lab Results  Component Value Date   PLT 168 12/29/2014    Medications  Scheduled Meds: . antiseptic oral rinse  7 mL Mouth Rinse BID  . feeding supplement (ENSURE ENLIVE)  237 mL Oral BID BM  . folic acid  1 mg Oral Daily  . lactulose  300 mL Rectal BID  . LORazepam  1 mg Intravenous Q4H  .  LORazepam  0-4 mg Oral Q6H   Followed by  . [START ON 12/30/2014] LORazepam  0-4 mg Oral Q12H  . multivitamin with minerals  1 tablet Oral Daily  . pantoprazole (PROTONIX) IV  40 mg Intravenous Q24H  . pneumococcal 23 valent vaccine  0.5 mL Intramuscular Tomorrow-1000  . sodium chloride  3 mL Intravenous Q12H  . thiamine  100 mg Oral Daily   Or  . thiamine  100 mg Intravenous Daily   Continuous Infusions: . 0.9 % NaCl with KCl 20 mEq / L 75 mL/hr at 12/29/14 0240   PRN Meds:.alum & mag hydroxide-simeth, LORazepam **OR** LORazepam, morphine injection,  ondansetron **OR** ondansetron (ZOFRAN) IV, traZODone  Antibiotics    Anti-infectives    None        Subjective:   Georjean Mode seen and examined today.  Patient currently somnolent.  Moves extremities sporadically.  Objective:   Filed Vitals:   12/28/14 2134 12/28/14 2230 12/29/14 0240 12/29/14 0650  BP: 118/58 124/60 104/49 111/63  Pulse: 116 110 115 118  Temp: 97.8 F (36.6 C)  98.3 F (36.8 C) 98.6 F (37 C)  TempSrc: Oral   Axillary  Resp: $Remo'16  18 18  'ANzAe$ Height:      Weight:    82.645 kg (182 lb 3.2 oz)  SpO2: 93%  94% 95%    Wt Readings from Last 3 Encounters:  12/29/14 82.645 kg (182 lb 3.2 oz)     Intake/Output Summary (Last 24 hours) at 12/29/14 1109 Last data filed at 12/29/14 0900  Gross per 24 hour  Intake      0 ml  Output      0 ml  Net      0 ml    Exam  General: Well developed, NAD  HEENT: NCAT, Sceral icterus,  mucous membranes moist.   Cardiovascular: S1 S2 auscultated, no rubs, murmurs or gallops. Regular rate and rhythm.  Respiratory: Clear to auscultation bilaterally with equal chest rise  Abdomen: Soft, nontender, mildly distended, + bowel sounds, +HSM  Extremities: warm dry without cyanosis clubbing or edema  Neuro: unable to evaluate due to current state   Skin: Without rashes exudates or nodules, Jaundice  Data Review   Micro Results No results found for this or any previous visit (from the past 240 hour(s)).  Radiology Reports Dg Chest 2 View  12/28/2014   CLINICAL DATA:  59 year old male with vomiting and cough for 1 week. Jaundice. Initial encounter.  EXAM: CHEST  2 VIEW  COMPARISON:  None.  FINDINGS: Low lung volumes. Mild crowding of markings at both lung bases. Normal cardiac size and mediastinal contours. Visualized tracheal air column is within normal limits. No pneumothorax, pulmonary edema or pleural effusion. No acute osseous abnormality identified.  IMPRESSION: Low lung volumes, otherwise no acute cardiopulmonary  abnormality.   Electronically Signed   By: Genevie Ann M.D.   On: 12/28/2014 09:35   Ct Abdomen Pelvis W Contrast  12/28/2014   CLINICAL DATA:  59 year old male with new onset jaundice since last week. Nausea vomiting and cold like illness symptoms for 3 weeks. Confusion today. Initial encounter.  EXAM: CT ABDOMEN AND PELVIS WITH CONTRAST  TECHNIQUE: Multidetector CT imaging of the abdomen and pelvis was performed using the standard protocol following bolus administration of intravenous contrast.  CONTRAST:  18mL OMNIPAQUE IOHEXOL 300 MG/ML  SOLN  COMPARISON:  Abdomen ultrasound 0857 hours today. Chest radiographs 0918 hours today.  FINDINGS: Atelectasis at both lung bases. Mostly calcified granuloma  in the right costophrenic angle. No pericardial or pleural effusion.  No acute osseous abnormality identified.  Small to moderate volume of pelvic ascites. Small fat containing inguinal hernia with trace fluid.  Space of Retzius varices. Otherwise negative bladder. Negative distal colon.  Sigmoid and left colon diverticulosis. Mild wall thickening of the 8 transverse colon and right colon probably is reactive. Normal appendix. No dilated small bowel, but there are thickened loops of small bowel in the left abdomen (series 2, image 73). Negative stomach. Mild duodenum all thickening and small second portion duodenum diverticulum on series 2, image 47.  Splenomegaly, splenic volume calculated at 1,206 mL (normal splenic volume range 83 - 412 mL). Small to medium caliber varices at the root of the mesentery and tracking within the small bowel mesentery. Recannulized paraumbilical vein and small caliber are ventral abdominal wall varices. Small volume abdominal ascites.  Negative pancreas and adrenal glands. Renal enhancement and contrast excretion within normal limits. Aortoiliac calcified atherosclerosis noted. Major arterial structures are patent.  Portal venous system is patent. Moderately decreased density throughout  the liver with irregular liver contour. No discrete liver mass. Moderately distended gallbladder with dependent gallstones. No dilatation of the CBD. No dilated intrahepatic biliary tree.  No lymphadenopathy. Small volume of ascites along the distal thoracic esophagus.  IMPRESSION: 1. Hepatic steatosis and cirrhosis with portal venous hypertension including splenomegaly, varices, ascites. No discrete liver mass is identified. 2. Moderately distended gallbladder with cholelithiasis, but no intra- or extrahepatic biliary ductal dilatation. 3. Reactive appearing small and large bowel wall thickening. 4. Pulmonary atelectasis.   Electronically Signed   By: Genevie Ann M.D.   On: 12/28/2014 11:23   US Abdomen Limited  12/28/2014   CLINICAL DATA:  Jaundice, right upper quadrant discomfort for the past 3 weeks; history of alcohol abuse, nausea, vomiting, and confusion.  EXAM: US ABDOMEN LIMITED - RIGHT UPPER QUADRANT  COMPARISON:  None  FINDINGS: Gallbladder:  The gallbladder is distended and contains echogenic material likely inspissated bile or sludge. The gallbladder wall is thickened to 7 mm. There is no intramural fluid or positive sonographic Murphy's sign. There is adjacent ascites.  Common bile duct:  Diameter: 5 mm  Liver:  There is marked attenuation of the ultrasound beam by the hepatic echotexture. There is no discrete mass or ductal dilation but evaluation is limited. No focal lesion identified. Within normal limits in parenchymal echogenicity.  IMPRESSION: 1. The gallbladder is mildly distended and the lumen largely filled with echogenic material which may reflect sludge or echogenic bile. There is gallbladder wall thickening. No discrete gallbladder mass is demonstrated but an intraluminal mass could mimic the findings here. 2. Increased hepatic echotexture with poor definition of the parenchymal architecture. 3. CT scanning of the abdomen is recommended for further evaluation of the liver, gallbladder, and  pancreas.   Electronically Signed   By: David  Martinique M.D.   On: 12/28/2014 09:20    CBC  Recent Labs Lab 12/28/14 0800 12/29/14 0617  WBC 11.7* 8.9  HGB 10.7* 9.5*  HCT 30.4* 27.0*  PLT 209 168  MCV 107.8* 108.9*  MCH 37.9* 38.3*  MCHC 35.2 35.2  RDW 18.1* 18.2*  LYMPHSABS 1.0  --   MONOABS 1.0  --   EOSABS 0.0  --   BASOSABS 0.0  --     Chemistries   Recent Labs Lab 12/28/14 0800 12/28/14 1414 12/29/14 0613  NA 127*  --  131*  K 2.7*  --  2.8*  CL  87*  --  94*  CO2 26  --  28  GLUCOSE 113*  --  94  BUN 25*  --  23  CREATININE 0.80  --  0.42*  CALCIUM 8.2*  --  8.0*  MG  --  1.7  --   AST 153*  --  156*  ALT 31  --  28  ALKPHOS 156*  --  136*  BILITOT 25.8*  --  25.5*   ------------------------------------------------------------------------------------------------------------------ estimated creatinine clearance is 107.2 mL/min (by C-G formula based on Cr of 0.42). ------------------------------------------------------------------------------------------------------------------ No results for input(s): HGBA1C in the last 72 hours. ------------------------------------------------------------------------------------------------------------------ No results for input(s): CHOL, HDL, LDLCALC, TRIG, CHOLHDL, LDLDIRECT in the last 72 hours. ------------------------------------------------------------------------------------------------------------------  Recent Labs  12/28/14 1414  TSH 1.136   ------------------------------------------------------------------------------------------------------------------  Recent Labs  12/28/14 1414  VITAMINB12 805  FOLATE 0.8*  FERRITIN 2258*  TIBC NOT CALC  IRON 92  RETICCTPCT 3.3*    Coagulation profile  Recent Labs Lab 12/28/14 0800 12/29/14 1041  INR 1.54* 1.82*    No results for input(s): DDIMER in the last 72 hours.  Cardiac Enzymes No results for input(s): CKMB, TROPONINI, MYOGLOBIN in the last 168  hours.  Invalid input(s): CK ------------------------------------------------------------------------------------------------------------------ Invalid input(s): POCBNP    Ausha Sieh D.O. on 12/29/2014 at 11:09 AM  Between 7am to 7pm - Pager - [phone]  After 7pm go to [email or web address] - password [email or web address]  And look for the night coverage person covering for me after hours  [facility] Office  [phone]

## 2014-12-30 ENCOUNTER — Encounter (HOSPITAL_COMMUNITY): Payer: Self-pay | Admitting: Student

## 2014-12-30 DIAGNOSIS — K7460 Unspecified cirrhosis of liver: Secondary | ICD-10-CM

## 2014-12-30 DIAGNOSIS — R161 Splenomegaly, not elsewhere classified: Secondary | ICD-10-CM

## 2014-12-30 DIAGNOSIS — K7031 Alcoholic cirrhosis of liver with ascites: Secondary | ICD-10-CM | POA: Insufficient documentation

## 2014-12-30 DIAGNOSIS — K760 Fatty (change of) liver, not elsewhere classified: Secondary | ICD-10-CM

## 2014-12-30 DIAGNOSIS — K766 Portal hypertension: Secondary | ICD-10-CM

## 2014-12-30 LAB — COMPREHENSIVE METABOLIC PANEL
ALK PHOS: 113 U/L (ref 39–117)
ALT: 28 U/L (ref 0–53)
AST: 141 U/L — ABNORMAL HIGH (ref 0–37)
Albumin: 1.8 g/dL — ABNORMAL LOW (ref 3.5–5.2)
Anion gap: 9 (ref 5–15)
BILIRUBIN TOTAL: 26.3 mg/dL — AB (ref 0.3–1.2)
BUN: 21 mg/dL (ref 6–23)
CALCIUM: 7.8 mg/dL — AB (ref 8.4–10.5)
CO2: 24 mmol/L (ref 19–32)
CREATININE: 0.44 mg/dL — AB (ref 0.50–1.35)
Chloride: 101 mmol/L (ref 96–112)
GFR calc Af Amer: >90 mL/min (ref >90)
GFR calc non Af Amer: >90 mL/min (ref >90)
Glucose, Bld: 106 mg/dL — ABNORMAL HIGH (ref 70–99)
Potassium: 3.2 mmol/L — ABNORMAL LOW (ref 3.5–5.1)
SODIUM: 134 mmol/L — AB (ref 135–145)
Total Protein: 5.4 g/dL — ABNORMAL LOW (ref 6.0–8.3)

## 2014-12-30 LAB — CBC
HCT: 26.8 % — ABNORMAL LOW (ref 39.0–52.0)
Hemoglobin: 9.4 g/dL — ABNORMAL LOW (ref 13.0–17.0)
MCH: 38.7 pg — ABNORMAL HIGH (ref 26.0–34.0)
MCHC: 35.1 g/dL (ref 30.0–36.0)
MCV: 110.3 fL — ABNORMAL HIGH (ref 78.0–100.0)
PLATELETS: 167 10*3/uL (ref 150–400)
RBC: 2.43 MIL/uL — AB (ref 4.22–5.81)
RDW: 18.5 % — ABNORMAL HIGH (ref 11.5–15.5)
WBC: 10.7 10*3/uL — AB (ref 4.0–10.5)

## 2014-12-30 LAB — PROTIME-INR
INR: 1.94 — AB (ref 0.00–1.49)
Prothrombin Time: 22.4 seconds — ABNORMAL HIGH (ref 11.6–15.2)

## 2014-12-30 LAB — AMMONIA: AMMONIA: 45 umol/L — AB (ref 11–32)

## 2014-12-30 MED ORDER — VITAMIN K1 10 MG/ML IJ SOLN
10.0000 mg | Freq: Once | INTRAMUSCULAR | Status: AC
  Administered 2014-12-30: 10 mg via SUBCUTANEOUS
  Filled 2014-12-30: qty 1

## 2014-12-30 MED ORDER — METHYLPREDNISOLONE SODIUM SUCC 40 MG IJ SOLR
32.0000 mg | Freq: Every day | INTRAMUSCULAR | Status: DC
  Administered 2014-12-30 – 2015-01-02 (×4): 32 mg via INTRAVENOUS
  Filled 2014-12-30 (×4): qty 1

## 2014-12-30 MED ORDER — POTASSIUM CHLORIDE 10 MEQ/100ML IV SOLN
10.0000 meq | INTRAVENOUS | Status: AC
  Administered 2014-12-30 (×4): 10 meq via INTRAVENOUS
  Filled 2014-12-30 (×3): qty 100

## 2014-12-30 MED ORDER — LORAZEPAM 2 MG/ML IJ SOLN: 0.2500 mg | INTRAMUSCULAR | Status: DC

## 2014-12-30 MED ORDER — LORAZEPAM 2 MG/ML IJ SOLN
0.2500 mg | INTRAMUSCULAR | Status: DC
  Administered 2014-12-30 – 2015-01-03 (×23): 0.25 mg via INTRAVENOUS
  Filled 2014-12-30 (×24): qty 1

## 2014-12-30 NOTE — Progress Notes (Signed)
CRITICAL VALUE ALERT  Critical value received:  Bilirubin 26.3  Date of notification:  12/30/14  Time of notification:  0730  Critical value read back:Yes.    Nurse who received alert:  Dagoberto LigasJessica Aleane Wesenberg, RN  MD notified (1st page):  Dr. Catha GosselinMikhail  Time of first page:  [phone]  MD notified (2nd page):  Time of second page:  Responding MD:  Dr. Catha GosselinMikhail  Time MD responded:  [phone]

## 2014-12-30 NOTE — Care Management Note (Addendum)
    Page 1 of 1   01/04/2015     10:53:51 AM CARE MANAGEMENT NOTE 01/04/2015  Patient:  Gean QuintBAKER,Tyriq   Account Number:  [id number]  Date Initiated:  12/30/2014  Documentation initiated by:  Kathyrn SheriffHILDRESS,JESSICA  Subjective/Objective Assessment:   Pt is from home, admitted with cirrhosis and ETOH withdrawal. Pt unable to complete assessment at this time. Pt has no insurance or PCP. Pt referred to financial counselor. Will cont to follow.     Action/Plan:   Anticipated DC Date:  01/04/2015   Anticipated DC Plan:  HOME/SELF CARE  In-house referral  Financial Counselor  Clinical Social Worker  Hospice / Palliative Care      DC Planning Services  CM consult      [facility]   Choice offered to / List presented to:             Status of service:  Completed, signed off Medicare Important Message given?   (If response is "NO", the following Medicare IM given date fields will be blank) Date Medicare IM given:   Medicare IM given by:   Date Additional Medicare IM given:   Additional Medicare IM given by:    Discharge Disposition:  HOSPICE MEDICAL FACILITY  Per UR Regulation:  Reviewed for med. necessity/level of care/duration of stay  If discussed at Long Length of Stay Meetings, dates discussed:   01/03/2015    Comments:  01/03/2015 1500 Kathyrn SheriffJessica Childress, RN, MSN, CM Palliative consult completed. Pt and family have decided on pt going to Hospice Facility. CSW is consulted and will arrange for Hospice facility placement. No CM needs. Discharge anticipated today.  12/30/2014 1044 Kathyrn SheriffJessica Childress, RN, MSN, CM

## 2014-12-30 NOTE — Progress Notes (Signed)
Subjective: Patient is non-responsive to verbal or tactile stimuli including sternal rub.  Objective: Vital signs in last 24 hours: Temp:  [98 F (36.7 C)-98.1 F (36.7 C)] 98.1 F (36.7 C) (04/22 0621) Pulse Rate:  [105-114] 110 (04/22 0621) Resp:  [18] 18 (04/22 0621) BP: (101-107)/(57-63) 105/63 mmHg (04/22 0621) SpO2:  [90 %-91 %] 90 % (04/22 0621) Weight:  [187 lb 12.8 oz (85.186 kg)] 187 lb 12.8 oz (85.186 kg) (04/22 0621) Last BM Date: 12/28/14 General:   Somnolent, unresponsive. Occasional minor movements of his knees and hands.  Head:  Normocephalic and atraumatic. Eyes:  Scleral icterus   Heart:  S1, S2 present, no murmurs noted. Tachycardic rate.  Lungs: Clear to auscultation bilaterally, without wheezing, rales, or rhonchi. Tachypnic and apparently labored breathing Abdomen:  Bowel sounds present, soft, non-tender, mildly distended. No hernias noted. Hepatomegaly. No rebound or guarding. No masses appreciated  GU: Foley bag with noted dark brown "cola colored" urine. Pulses:  Normal pulses noted. Extremities:  Without clubbing or edema. Neurologic:  Unable to assess due to unresponsiveness. Skin:  Grossly jaundiced. Psych:  Unable to assess, appears not agitated on sedation, occasional extremity movements noted.  Intake/Output from previous day: 04/21 0701 - 04/22 0700 In: 2530 [I.V.:2080; IV Piggyback:450] Out: 700 [Urine:700] Intake/Output this shift:    Lab Results:  Recent Labs  12/28/14 0800 12/29/14 0617 12/30/14 0654  WBC 11.7* 8.9 10.7*  HGB 10.7* 9.5* 9.4*  HCT 30.4* 27.0* 26.8*  PLT 209 168 167   BMET  Recent Labs  12/28/14 0800 12/29/14 0613 12/30/14 0654  NA 127* 131* 134*  K 2.7* 2.8* 3.2*  CL 87* 94* 101  CO2 '26 28 24  ' GLUCOSE 113* 94 106*  BUN 25* 23 21  CREATININE 0.80 0.42* 0.44*  CALCIUM 8.2* 8.0* 7.8*   LFT  Recent Labs  12/28/14 0800 12/29/14 0613 12/30/14 0654  PROT 6.7 5.7* 5.4*  ALBUMIN 2.4* 2.0* 1.8*   AST 153* 156* 141*  ALT '31 28 28  ' ALKPHOS 156* 136* 113  BILITOT 25.8* 25.5* 26.3*   PT/INR  Recent Labs  12/29/14 1041 12/30/14 0654  LABPROT 21.2* 22.4*  INR 1.82* 1.94*   Hepatitis Panel  Recent Labs  12/28/14 0800  HEPBSAG NEGATIVE  HCVAB NEGATIVE  HEPAIGM NON REACTIVE  HEPBIGM NON REACTIVE     Studies/Results: Dg Chest 2 View  12/28/2014   CLINICAL DATA:  59 year old male with vomiting and cough for 1 week. Jaundice. Initial encounter.  EXAM: CHEST  2 VIEW  COMPARISON:  None.  FINDINGS: Low lung volumes. Mild crowding of markings at both lung bases. Normal cardiac size and mediastinal contours. Visualized tracheal air column is within normal limits. No pneumothorax, pulmonary edema or pleural effusion. No acute osseous abnormality identified.  IMPRESSION: Low lung volumes, otherwise no acute cardiopulmonary abnormality.   Electronically Signed   By: Genevie Ann M.D.   On: 12/28/2014 09:35   Ct Abdomen Pelvis W Contrast  12/28/2014   CLINICAL DATA:  59 year old male with new onset jaundice since last week. Nausea vomiting and cold like illness symptoms for 3 weeks. Confusion today. Initial encounter.  EXAM: CT ABDOMEN AND PELVIS WITH CONTRAST  TECHNIQUE: Multidetector CT imaging of the abdomen and pelvis was performed using the standard protocol following bolus administration of intravenous contrast.  CONTRAST:  147m OMNIPAQUE IOHEXOL 300 MG/ML  SOLN  COMPARISON:  Abdomen ultrasound 0857 hours today. Chest radiographs 0918 hours today.  FINDINGS: Atelectasis at both lung bases.  Mostly calcified granuloma in the right costophrenic angle. No pericardial or pleural effusion.  No acute osseous abnormality identified.  Small to moderate volume of pelvic ascites. Small fat containing inguinal hernia with trace fluid.  Space of Retzius varices. Otherwise negative bladder. Negative distal colon.  Sigmoid and left colon diverticulosis. Mild wall thickening of the 8 transverse colon and right  colon probably is reactive. Normal appendix. No dilated small bowel, but there are thickened loops of small bowel in the left abdomen (series 2, image 73). Negative stomach. Mild duodenum all thickening and small second portion duodenum diverticulum on series 2, image 47.  Splenomegaly, splenic volume calculated at 1,206 mL (normal splenic volume range 83 - 412 mL). Small to medium caliber varices at the root of the mesentery and tracking within the small bowel mesentery. Recannulized paraumbilical vein and small caliber are ventral abdominal wall varices. Small volume abdominal ascites.  Negative pancreas and adrenal glands. Renal enhancement and contrast excretion within normal limits. Aortoiliac calcified atherosclerosis noted. Major arterial structures are patent.  Portal venous system is patent. Moderately decreased density throughout the liver with irregular liver contour. No discrete liver mass. Moderately distended gallbladder with dependent gallstones. No dilatation of the CBD. No dilated intrahepatic biliary tree.  No lymphadenopathy. Small volume of ascites along the distal thoracic esophagus.  IMPRESSION: 1. Hepatic steatosis and cirrhosis with portal venous hypertension including splenomegaly, varices, ascites. No discrete liver mass is identified. 2. Moderately distended gallbladder with cholelithiasis, but no intra- or extrahepatic biliary ductal dilatation. 3. Reactive appearing small and large bowel wall thickening. 4. Pulmonary atelectasis.   Electronically Signed   By: Genevie Ann M.D.   On: 12/28/2014 11:23    Assessment: 58 year old male admitted with alcoholic hepatitis, likely underlying ETOH cirrhosis, negative viral markers, no obvious signs/symptoms of infection but significantly altered mental status. No obvious signs of overt GI bleeding. Unable to obtain subjective information due to minimally responsive state.   Discriminant function: 41-58 with PT controls and MELD 24. 30 day  mortality rate high; as viral markers negative and no obvious signs/symptoms of infection, needs prednisolone. He is still not safe for po intake due to minimally responsive state and risk for aspiration. Needs to remain NPO.  Anemia: multifactorial without overt signs of GI bleeding. Will ultimately need outpatient initial EGD for variceal screening and screening colonoscopy.   Labs appear to be worsening. INR increased to 1.94 despite 10 mg Vit K sq yesterday. Bilirubin continues to increase, is at 26.3 (25.5 yesterday), albumin decreased to 1.8 (2.4 on admission). Sodium and Potassium have improved. Alk phos has normalized at 113 from 156 on admission. Return to mild leukocytosis at 10.7 although remains afebrile. H/H stable from yesterday. Platelets stable. Ammonia decreased to 45 with lactulose enemas.   Remains in CIWA protocol for ETOH withdrawal, last Ativan at 07:12 this morning. Is tachycardic and tacypneic, although cannot determine if this is due from ETOH versus hepatic pathology. At this point in his decompensated state corticosteroids are imperative, but still unable to tolerate po intake due to mental status. Consulted with Pharmacy on IV equivalent dose for po prednisolone. Per pharmacist, a study was done that utilized 32 mg IV Solu-Medrol as equivilent for patients not able to tolerate po prednisolone 40 mg (Louver, A. et al. (2007). [facility]: A new tool for therapeutic strategy in patients with severe alcoholic hepatitis treated with steroids. Hepatology, 45(6), B8246525. doi: 10.1002/hep.21607)   Plan: 1. Continue lactulose enemas 2. Continue bid PPI  for prophylaxis 3. Continue NPO 4. Hold back on Ativan as much as possible for mental state 5. Will give one additional 10 mg sq Vitamin K for INR 6. Per pharmacy consult, 32 mg iv Solu-Medrol daily until able to tolerate po predisolone.   Walden Field, AGNP-C Adult & Gerontological Nurse Practitioner [facility]  [facility]     LOS: 2 days    12/30/2014, 9:17 AM  Attending note:  Patient seen and examined. Case discussed at length with patient's sister, Payton Spark, nursing staff and Dr. Ree Kida.  Prognosis is rather poor. He may well not survive this acute illness. He is not a transplant candidate. Understand need for Ativan but would lean towards more conservative doses in the hopes of improving obtundation. Suggest  continuing supportive measures and agree with Solu-Medrol until when and if he's able to take by mouth.  Dr. Laural Golden will see as needed over the weekend.

## 2014-12-30 NOTE — Consult Note (Signed)
Consult requested by: Dr.Mikhail  Consult requested for possible intubation:  HPI: This is a 59 year old who was in his usual state of poor health at home and over the last several days has become increasingly jaundiced and increasingly confused. He lives with his sister and she says that up until 3 or 4 days ago he was able to continue his regular activities but he has become much more confused. Since he's been in the hospital he has become more somnolent and more poorly responsive. He is not known to have any lung disease but does have a significant smoking history in the past. He has severe liver disease with cirrhosis/hepatic keratosis portal hypertension and ascites  Past Medical History  Diagnosis Date  . Cirrhosis   . Hepatic steatosis   . Spleen enlarged   . Jaundice   . Hyperbilirubinemia   . Portal hypertension   . ETOH abuse   . Cholelithiasis   . Anemia   . Hypokalemia   . Hyponatremia   . Ascites   . Diverticulosis   . Inguinal hernia      Family History  Problem Relation Age of Onset  . Colon cancer Neg Hx      History   Social History  . Marital Status: Legally Separated    Spouse Name: N/A  . Number of Children: N/A  . Years of Education: N/A   Social History Main Topics  . Smoking status: Never Smoker   . Smokeless tobacco: Not on file  . Alcohol Use: Yes     Comment: daily- moderate amount  . Drug Use: No  . Sexual Activity: Not on file   Other Topics Concern  . None   Social History Narrative     ROS: Unobtainable    Objective: Vital signs in last 24 hours: Temp:  [98 F (36.7 C)-98.1 F (36.7 C)] 98.1 F (36.7 C) (04/22 0621) Pulse Rate:  [105-114] 110 (04/22 0621) Resp:  [18-22] 22 (04/22 1100) BP: (101-107)/(57-63) 105/63 mmHg (04/22 0621) SpO2:  [90 %-91 %] 90 % (04/22 1100) Weight:  [85.186 kg (187 lb 12.8 oz)] 85.186 kg (187 lb 12.8 oz) (04/22 0621) Weight change: -3.266 kg (-7 lb 3.2 oz) Last BM Date:  12/28/14  Intake/Output from previous day: 04/21 0701 - 04/22 0700 In: 2530 [I.V.:2080; IV Piggyback:450] Out: 700 [Urine:700]  PHYSICAL EXAM He is very jaundiced. He has respirations in the 20s. These are rather shallow. He is poorly responsive. His mucous membranes are slightly dry. His neck is supple. Chest shows tachypnea and some rhonchi. His heart is regular. His abdomen is soft mildly tender in the right upper quadrant. Extremity showed no edema central nervous system exam difficult to assess but he does move all 4 extremities.  Lab Results: Basic Metabolic Panel:  Recent Labs  16/10/96 1414 12/29/14 0613 12/30/14 0654  NA  --  131* 134*  K  --  2.8* 3.2*  CL  --  94* 101  CO2  --  28 24  GLUCOSE  --  94 106*  BUN  --  23 21  CREATININE  --  0.42* 0.44*  CALCIUM  --  8.0* 7.8*  MG 1.7  --   --    Liver Function Tests:  Recent Labs  12/29/14 0613 12/30/14 0654  AST 156* 141*  ALT 28 28  ALKPHOS 136* 113  BILITOT 25.5* 26.3*  PROT 5.7* 5.4*  ALBUMIN 2.0* 1.8*   No results for input(s): LIPASE, AMYLASE in the last  72 hours.  Recent Labs  12/29/14 1120 12/30/14 0654  AMMONIA 64* 45*   CBC:  Recent Labs  12/28/14 0800 12/29/14 0617 12/30/14 0654  WBC 11.7* 8.9 10.7*  NEUTROABS 9.7*  --   --   HGB 10.7* 9.5* 9.4*  HCT 30.4* 27.0* 26.8*  MCV 107.8* 108.9* 110.3*  PLT 209 168 167   Cardiac Enzymes: No results for input(s): CKTOTAL, CKMB, CKMBINDEX, TROPONINI in the last 72 hours. BNP: No results for input(s): PROBNP in the last 72 hours. D-Dimer: No results for input(s): DDIMER in the last 72 hours. CBG: No results for input(s): GLUCAP in the last 72 hours. Hemoglobin A1C: No results for input(s): HGBA1C in the last 72 hours. Fasting Lipid Panel: No results for input(s): CHOL, HDL, LDLCALC, TRIG, CHOLHDL, LDLDIRECT in the last 72 hours. Thyroid Function Tests:  Recent Labs  12/28/14 1414  TSH 1.136   Anemia Panel:  Recent Labs   12/28/14 1414  VITAMINB12 805  FOLATE 0.8*  FERRITIN 2258*  TIBC NOT CALC  IRON 92  RETICCTPCT 3.3*   Coagulation:  Recent Labs  12/29/14 1041 12/30/14 0654  LABPROT 21.2* 22.4*  INR 1.82* 1.94*   Urine Drug Screen: Drugs of Abuse     Component Value Date/Time   LABOPIA POSITIVE* 12/28/2014 1117   COCAINSCRNUR NONE DETECTED 12/28/2014 1117   LABBENZ NONE DETECTED 12/28/2014 1117   AMPHETMU NONE DETECTED 12/28/2014 1117   THCU NONE DETECTED 12/28/2014 1117   LABBARB NONE DETECTED 12/28/2014 1117    Alcohol Level:  Recent Labs  12/28/14 0800  ETH 129*   Urinalysis:  Recent Labs  12/28/14 1117  COLORURINE BROWN*  LABSPEC 1.010  PHURINE 6.5  GLUCOSEU 100*  HGBUR NEGATIVE  BILIRUBINUR LARGE*  KETONESUR TRACE*  PROTEINUR 30*  UROBILINOGEN >8.0*  NITRITE POSITIVE*  LEUKOCYTESUR NEGATIVE   Misc. Labs:   ABGS: No results for input(s): PHART, PO2ART, TCO2, HCO3 in the last 72 hours.  Invalid input(s): PCO2   MICROBIOLOGY: No results found for this or any previous visit (from the past 240 hour(s)).  Studies/Results: No results found.  Medications:  Prior to Admission:  Prescriptions prior to admission  Medication Sig Dispense Refill Last Dose  . dextromethorphan-guaiFENesin (ROBITUSSIN-DM) 10-100 MG/5ML liquid Take 30 mLs by mouth every 4 (four) hours as needed for cough.   12/27/2014 at Unknown time  . Pseudoeph-Doxylamine-DM-APAP (NYQUIL MULTI-SYMPTOM PO) Take 30 mLs by mouth daily.   12/27/2014 at Unknown time   Scheduled: . antiseptic oral rinse  7 mL Mouth Rinse BID  . feeding supplement (ENSURE ENLIVE)  237 mL Oral BID BM  . folic acid  1 mg Oral Daily  . lactulose  300 mL Rectal BID  . LORazepam  0.25 mg Intravenous Q4H  . LORazepam  0-4 mg Oral Q6H   Followed by  . LORazepam  0-4 mg Oral Q12H  . methylPREDNISolone (SOLU-MEDROL) injection  32 mg Intravenous Daily  . multivitamin with minerals  1 tablet Oral Daily  . pantoprazole  (PROTONIX) IV  40 mg Intravenous Q24H  . potassium chloride  10 mEq Intravenous Q1 Hr x 4  . sodium chloride  3 mL Intravenous Q12H  . thiamine  100 mg Oral Daily   Or  . thiamine  100 mg Intravenous Daily   Continuous: . 0.9 % NaCl with KCl 20 mEq / L 75 mL/hr at 12/30/14 1100   NWG:NFAOPRN:alum & mag hydroxide-simeth, LORazepam **OR** LORazepam, morphine injection, ondansetron **OR** ondansetron (ZOFRAN) IV, traZODone  Assesment:  He has what appears to be acute on chronic hepatic failure. He is poorly responsive. His sister is at bedside and says other family members are on their way. She wants to discuss with them about CODE STATUS etc. If no CODE BLUE status is what they decide on that would simply continue with current treatments. If on the other hand they would want him to be resuscitated then I think he would require transfer to ICU and intubation for airway protection. She tells me that she does not think that her family is going to want to do that but she does want to have the discussion with them. Principal Problem:   Cirrhosis Active Problems:   Ascites   Hyponatremia   Hypokalemia   Anemia   Cholelithiasis   Hyperbilirubinemia   Portal hypertension   ETOH abuse   Elevated alkaline phosphatase level   Alcoholic cirrhosis of liver with ascites    Plan: Await family discussion    LOS: 2 days   Sheetal Lyall L 12/30/2014, 1:14 PM

## 2014-12-30 NOTE — Progress Notes (Signed)
[facility]                                                                              Patient Demographics  Franklin Sloan, is a 59 y.o. male, DOB - 02/14/1956, YNW:[id number]  Admit date - 12/28/2014   Admitting Physician Edsel PetrinMaryann Ival Basquez, DO  Outpatient Primary MD for the patient is No primary care provider on file.  LOS - 2   Chief Complaint  Patient presents with  . Jaundice      HPI on 12/29/2014 by Ms. Franklin SmothersKaren Black, NP Franklin QuintRobert Dross is a 59 y.o. male with a past medical history that includes EtOH abuse presents to the emergency department from home with the chief complaint of jaundice. Initial evaluation reveals, cirrhosis, cholelithiasis, hepatic steatosis, hyperbilirubinemia anemia, portal hypertension. Information is obtained from the patient. He states he lives with his sister she brought him to the emergency room today for a 2 day history of nausea and dry heaving and jaundice. Associated symptoms include abdominal pain described as intermittent sharp located in the upper right quadrant, decreased by mouth intake and decreased energy. Patient reports he drinks 1/2 of a fifth to a whole fifth of vodka daily for the last 2 years. He reports 4 years prior that he stopped drinking altogether. He reports not noticing his own yellow color. He denies fever chills chest pain palpitations shortness of breath cough headache visual disturbances syncope or near-syncope. He denies dysuria hematuria frequency or urgency he denies diarrhea constipation melena. Workup in the emergency department significant for WBCs 11.7 hemoglobin 10.7 sodium 127 potassium 2.7 chloride 87 BUN 25 INR 1.5 for serum glucose 113. Abdominal ultrasound reveals gallbladder distended with possible sludge and wall thickening, CT of the abdomen pelvis reveals hepatic steatosis and cirrhosis with portal venous hypertension including splenomegaly varices and ascites, not really distended gallbladder with cholelithiasis but no  biliary ductal dilation, small and large bowel wall thickening, pulmonary atelectasis. In the emergency department he is afebrile hemodynamically stable and not hypoxic  Assessment & Plan   Jaundice/alcoholic cirrhosis/hepatitis/portal hypertension -Found on CT of the abdomen -Gastroenterology consulted and aappreciated -Continue lactulose enema, PPI  -Started on solumedrol 32mg  IV daily -MELD Score upon admission >22, discriminat function > 40 -Patient is at high mortality risk  Hyperbilirubinemia -Secondary to above, continue to monitor  Hyperammonemia -Ammonia level improving with lactulose enema  Alcohol abuse -Alcohol level 129 -Continue CIWA protocol   Ascites  -Secondary to above -Small amount per CT   Hyponatremia -Improved with IVF, will continue to monitor -TSH 1.136  Hypokalemia -Continue to replace -Magnesium 1.7, will replace  Macrocytic Anemia -iron 92, Ferritin 2258 (likely APR) -Continue to monitor CBC -No signs of bleeding -Patient would benefit from EGD/Colonoscopy  Coagulopathy -Patient was given Vit K 10mg  for INR of 1.8 (4/21), however INR continues to rise -Additional Vit K ordered  Code Status: Full, spoke with patient's sister regarding CODE status, she will have to speak to patient's ex wife and daughter.   Family Communication: None at bedside, Sister via phone  Disposition Plan: Admitted.   Time Spent in minutes   30 minutes  Procedures  None  Consults   Gastroenterology  DVT  Prophylaxis  SCDs  Lab Results  Component Value Date   PLT 167 12/30/2014    Medications  Scheduled Meds: . antiseptic oral rinse  7 mL Mouth Rinse BID  . feeding supplement (ENSURE ENLIVE)  237 mL Oral BID BM  . folic acid  1 mg Oral Daily  . lactulose  300 mL Rectal BID  . LORazepam  1 mg Intravenous Q4H  . LORazepam  0-4 mg Oral Q6H   Followed by  . LORazepam  0-4 mg Oral Q12H  . methylPREDNISolone (SOLU-MEDROL) injection  32 mg Intravenous  Daily  . multivitamin with minerals  1 tablet Oral Daily  . pantoprazole (PROTONIX) IV  40 mg Intravenous Q24H  . phytonadione  10 mg Subcutaneous Once  . sodium chloride  3 mL Intravenous Q12H  . thiamine  100 mg Oral Daily   Or  . thiamine  100 mg Intravenous Daily   Continuous Infusions: . 0.9 % NaCl with KCl 20 mEq / L 75 mL/hr at 12/30/14 1100   PRN Meds:.alum & mag hydroxide-simeth, LORazepam **OR** LORazepam, morphine injection, ondansetron **OR** ondansetron (ZOFRAN) IV, traZODone  Antibiotics    Anti-infectives    None        Subjective:   Franklin Quint seen and examined today.  Patient currently somnolent.  Moves extremities sporadically.  Objective:   Filed Vitals:   12/29/14 1457 12/29/14 2032 12/30/14 0621 12/30/14 1100  BP: 107/58 101/57 105/63   Pulse: 114 105 110   Temp: 98 F (36.7 C) 98.1 F (36.7 C) 98.1 F (36.7 C)   TempSrc: Oral Oral Oral   Resp: Height:      Weight:   85.186 kg (187 lb 12.8 oz)   SpO2:  91% 90% 90%    Wt Readings from Last 3 Encounters:  12/30/14 85.186 kg (187 lb 12.8 oz)     Intake/Output Summary (Last 24 hours) at 12/30/14 1128 Last data filed at 12/30/14 0600  Gross per 24 hour  Intake   2530 ml  Output    700 ml  Net   1830 ml    Exam  General: Well developed, somnolent  HEENT: NCAT, Sceral icterus,  mucous membranes moist.   Cardiovascular: S1 S2 auscultated, tachycardic  Respiratory: Clear to auscultation, tachypneic  Abdomen: Soft, nontender, mildly distended, + bowel sounds, +HSM  Extremities: warm dry without cyanosis clubbing or edema  Skin: Without rashes exudates or nodules, Jaundice  Data Review   Micro Results No results found for this or any previous visit (from the past 240 hour(s)).  Radiology Reports Dg Chest 2 View  12/28/2014   CLINICAL DATA:  59 year old male with vomiting and cough for 1 week. Jaundice. Initial encounter.  EXAM: CHEST  2 VIEW  COMPARISON:  None.   FINDINGS: Low lung volumes. Mild crowding of markings at both lung bases. Normal cardiac size and mediastinal contours. Visualized tracheal air column is within normal limits. No pneumothorax, pulmonary edema or pleural effusion. No acute osseous abnormality identified.  IMPRESSION: Low lung volumes, otherwise no acute cardiopulmonary abnormality.   Electronically Signed   By: Odessa Fleming M.D.   On: 12/28/2014 09:35   Ct Abdomen Pelvis W Contrast  12/28/2014   CLINICAL DATA:  59 year old male with new onset jaundice since last week. Nausea vomiting and cold like illness symptoms for 3 weeks. Confusion today. Initial encounter.  EXAM: CT ABDOMEN AND PELVIS WITH CONTRAST  TECHNIQUE: Multidetector CT imaging of the abdomen and pelvis  was performed using the standard protocol following bolus administration of intravenous contrast.  CONTRAST:  OMNIPAQUE IOHEXOL 300 MG/ML  SOLN  COMPARISON:  Abdomen ultrasound 0857 hours today. Chest radiographs 0918 hours today.  FINDINGS: Atelectasis at both lung bases. Mostly calcified granuloma in the right costophrenic angle. No pericardial or pleural effusion.  No acute osseous abnormality identified.  Small to moderate volume of pelvic ascites. Small fat containing inguinal hernia with trace fluid.  Space of Retzius varices. Otherwise negative bladder. Negative distal colon.  Sigmoid and left colon diverticulosis. Mild wall thickening of the 8 transverse colon and right colon probably is reactive. Normal appendix. No dilated small bowel, but there are thickened loops of small bowel in the left abdomen (series 2, image 73). Negative stomach. Mild duodenum all thickening and small second portion duodenum diverticulum on series 2, image 47.  Splenomegaly, splenic volume calculated at 1,206 mL (normal splenic volume range 83 - 412 mL). Small to medium caliber varices at the root of the mesentery and tracking within the small bowel mesentery. Recannulized paraumbilical vein and  small caliber are ventral abdominal wall varices. Small volume abdominal ascites.  Negative pancreas and adrenal glands. Renal enhancement and contrast excretion within normal limits. Aortoiliac calcified atherosclerosis noted. Major arterial structures are patent.  Portal venous system is patent. Moderately decreased density throughout the liver with irregular liver contour. No discrete liver mass. Moderately distended gallbladder with dependent gallstones. No dilatation of the CBD. No dilated intrahepatic biliary tree.  No lymphadenopathy. Small volume of ascites along the distal thoracic esophagus.  IMPRESSION: 1. Hepatic steatosis and cirrhosis with portal venous hypertension including splenomegaly, varices, ascites. No discrete liver mass is identified. 2. Moderately distended gallbladder with cholelithiasis, but no intra- or extrahepatic biliary ductal dilatation. 3. Reactive appearing small and large bowel wall thickening. 4. Pulmonary atelectasis.   Electronically Signed   By: Odessa Fleming M.D.   On: 12/28/2014 11:23   US Abdomen Limited  12/28/2014   CLINICAL DATA:  Jaundice, right upper quadrant discomfort for the past 3 weeks; history of alcohol abuse, nausea, vomiting, and confusion.  EXAM: US ABDOMEN LIMITED - RIGHT UPPER QUADRANT  COMPARISON:  None  FINDINGS: Gallbladder:  The gallbladder is distended and contains echogenic material likely inspissated bile or sludge. The gallbladder wall is thickened to 7 mm. There is no intramural fluid or positive sonographic Murphy's sign. There is adjacent ascites.  Common bile duct:  Diameter: 5 mm  Liver:  There is marked attenuation of the ultrasound beam by the hepatic echotexture. There is no discrete mass or ductal dilation but evaluation is limited. No focal lesion identified. Within normal limits in parenchymal echogenicity.  IMPRESSION: 1. The gallbladder is mildly distended and the lumen largely filled with echogenic material which may reflect sludge or  echogenic bile. There is gallbladder wall thickening. No discrete gallbladder mass is demonstrated but an intraluminal mass could mimic the findings here. 2. Increased hepatic echotexture with poor definition of the parenchymal architecture. 3. CT scanning of the abdomen is recommended for further evaluation of the liver, gallbladder, and pancreas.   Electronically Signed   By: David  [place] M.D.   On: 12/28/2014 09:20    CBC  Recent Labs Lab 12/28/14 0800 12/29/14 0617 12/30/14 0654  WBC 11.7* 8.9 10.7*  HGB 10.7* 9.5* 9.4*  HCT 30.4* 27.0* 26.8*  PLT 209 168 167  MCV 107.8* 108.9* 110.3*  MCH 37.9* 38.3* 38.7*  MCHC 35.2 35.2 35.1  RDW 18.1* 18.2* 18.5*  LYMPHSABS 1.0  --   --   MONOABS 1.0  --   --   EOSABS 0.0  --   --   BASOSABS 0.0  --   --     Chemistries   Recent Labs Lab 12/28/14 0800 12/28/14 1414 12/29/14 0613 12/30/14 0654  NA 127*  --  131* 134*  K 2.7*  --  2.8* 3.2*  CL 87*  --  94* 101  CO2 26  --  28 24  GLUCOSE 113*  --  94 106*  BUN 25*  --  23 21  CREATININE 0.80  --  0.42* 0.44*  CALCIUM 8.2*  --  8.0* 7.8*  MG  --  1.7  --   --   AST 153*  --  156* 141*  ALT 31  --  28 28  ALKPHOS 156*  --  136* 113  BILITOT 25.8*  --  25.5* 26.3*   ------------------------------------------------------------------------------------------------------------------ estimated creatinine clearance is 107.2 mL/min (by C-G formula based on Cr of 0.44). ------------------------------------------------------------------------------------------------------------------ No results for input(s): HGBA1C in the last 72 hours. ------------------------------------------------------------------------------------------------------------------ No results for input(s): CHOL, HDL, LDLCALC, TRIG, CHOLHDL, LDLDIRECT in the last 72 hours. ------------------------------------------------------------------------------------------------------------------  Recent Labs  12/28/14 1414    TSH 1.136   ------------------------------------------------------------------------------------------------------------------  Recent Labs  12/28/14 1414  VITAMINB12 805  FOLATE 0.8*  FERRITIN 2258*  TIBC NOT CALC  IRON 92  RETICCTPCT 3.3*    Coagulation profile  Recent Labs Lab 12/28/14 0800 12/29/14 1041 12/30/14 0654  INR 1.54* 1.82* 1.94*    No results for input(s): DDIMER in the last 72 hours.  Cardiac Enzymes No results for input(s): CKMB, TROPONINI, MYOGLOBIN in the last 168 hours.  Invalid input(s): CK ------------------------------------------------------------------------------------------------------------------ Invalid input(s): POCBNP    Tyrick Dunagan D.O. on 12/30/2014 at 11:28 AM  Between 7am to 7pm - Pager - [phone]  After 7pm go to [email or web address] - password [email or web address]  And look for the night coverage person covering for me after hours  [facility] Office  [phone]

## 2014-12-31 DIAGNOSIS — F1010 Alcohol abuse, uncomplicated: Secondary | ICD-10-CM

## 2014-12-31 DIAGNOSIS — E876 Hypokalemia: Secondary | ICD-10-CM

## 2014-12-31 DIAGNOSIS — K7031 Alcoholic cirrhosis of liver with ascites: Secondary | ICD-10-CM

## 2014-12-31 DIAGNOSIS — E871 Hypo-osmolality and hyponatremia: Secondary | ICD-10-CM

## 2014-12-31 DIAGNOSIS — K766 Portal hypertension: Secondary | ICD-10-CM

## 2014-12-31 LAB — CBC
HCT: 29 % — ABNORMAL LOW (ref 39.0–52.0)
Hemoglobin: 10 g/dL — ABNORMAL LOW (ref 13.0–17.0)
MCH: 38.5 pg — AB (ref 26.0–34.0)
MCHC: 34.5 g/dL (ref 30.0–36.0)
MCV: 111.5 fL — AB (ref 78.0–100.0)
Platelets: 182 10*3/uL (ref 150–400)
RBC: 2.6 MIL/uL — ABNORMAL LOW (ref 4.22–5.81)
RDW: 18.9 % — ABNORMAL HIGH (ref 11.5–15.5)
WBC: 13.3 10*3/uL — ABNORMAL HIGH (ref 4.0–10.5)

## 2014-12-31 LAB — COMPREHENSIVE METABOLIC PANEL
ALBUMIN: 1.8 g/dL — AB (ref 3.5–5.2)
ALK PHOS: 120 U/L — AB (ref 39–117)
ALT: 35 U/L (ref 0–53)
ANION GAP: 8 (ref 5–15)
AST: 131 U/L — ABNORMAL HIGH (ref 0–37)
BILIRUBIN TOTAL: 27.4 mg/dL — AB (ref 0.3–1.2)
BUN: 24 mg/dL — AB (ref 6–23)
CHLORIDE: 107 mmol/L (ref 96–112)
CO2: 23 mmol/L (ref 19–32)
CREATININE: 0.41 mg/dL — AB (ref 0.50–1.35)
Calcium: 8.2 mg/dL — ABNORMAL LOW (ref 8.4–10.5)
GFR calc Af Amer: >90 mL/min (ref >90)
GFR calc non Af Amer: >90 mL/min (ref >90)
Glucose, Bld: 134 mg/dL — ABNORMAL HIGH (ref 70–99)
Potassium: 3.8 mmol/L (ref 3.5–5.1)
Sodium: 138 mmol/L (ref 135–145)
TOTAL PROTEIN: 5.7 g/dL — AB (ref 6.0–8.3)

## 2014-12-31 LAB — AMMONIA: Ammonia: 47 umol/L — ABNORMAL HIGH (ref 11–32)

## 2014-12-31 LAB — PROTIME-INR
INR: 1.75 — ABNORMAL HIGH (ref 0.00–1.49)
PROTHROMBIN TIME: 20.6 s — AB (ref 11.6–15.2)

## 2014-12-31 NOTE — Progress Notes (Addendum)
Patient had 50cc dark urine in foley bag with condom cath on, bladder scanned with 170cc noted. MD order for foley catheter. #16 french inserted with 200 cc of dark amber urine.

## 2014-12-31 NOTE — Progress Notes (Addendum)
[facility]                                                                              Patient Demographics  Franklin Sloan, is a 59 y.o. male, DOB - 10/09/1955, ZOX:[id number]  Admit date - 12/28/2014   Admitting Physician Edsel Petrin, DO  Outpatient Primary MD for the patient is No primary care provider on file.  LOS - 3   Chief Complaint  Patient presents with  . Jaundice      HPI on 12/29/2014 by Ms. [name], NP Franklin Sloan is a 59 y.o. male with a past medical history that includes EtOH abuse presents to the emergency department from home with the chief complaint of jaundice. Initial evaluation reveals, cirrhosis, cholelithiasis, hepatic steatosis, hyperbilirubinemia anemia, portal hypertension. Information is obtained from the patient. He states he lives with his sister she brought him to the emergency room today for a 2 day history of nausea and dry heaving and jaundice. Associated symptoms include abdominal pain described as intermittent sharp located in the upper right quadrant, decreased by mouth intake and decreased energy. Patient reports he drinks 1/2 of a fifth to a whole fifth of vodka daily for the last 2 years. He reports 4 years prior that he stopped drinking altogether. He reports not noticing his own yellow color. He denies fever chills chest pain palpitations shortness of breath cough headache visual disturbances syncope or near-syncope. He denies dysuria hematuria frequency or urgency he denies diarrhea constipation melena. Workup in the emergency department significant for WBCs 11.7 hemoglobin 10.7 sodium 127 potassium 2.7 chloride 87 BUN 25 INR 1.5 for serum glucose 113. Abdominal ultrasound reveals gallbladder distended with possible sludge and wall thickening, CT of the abdomen pelvis reveals hepatic steatosis and cirrhosis with portal venous hypertension including splenomegaly varices and ascites, not really distended gallbladder with cholelithiasis but no  biliary ductal dilation, small and large bowel wall thickening, pulmonary atelectasis. In the emergency department he is afebrile hemodynamically stable and not hypoxic  Assessment & Plan   Jaundice/alcoholic cirrhosis/hepatitis/portal hypertension/Hepatic Encephalopathy -Found on CT of the abdomen -Gastroenterology consulted and aappreciated -Continue lactulose enema, PPI  -Continue solumedrol 32mg  IV daily -MELD Score upon admission >22, discriminat function > 40 -Patient is at high mortality risk, overall prognosis poor -Spoke with gastroenterology, unlikely to recover -Not a candidate for transplant  Hyperbilirubinemia -Secondary to above, continues to rise -Continue to monitor CMP  Hyperammonemia -Ammonia level improving with lactulose enema -Ammonia 47  Alcohol abuse -Alcohol level 129 upon admission -Continue CIWA protocol   Ascites  -Secondary to above -Small amount per CT   Hyponatremia -resolved, with IVF, will continue to monitor BMP -TSH 1.136  Hypokalemia -Resolved, continue to monitor BMP and replace as needed -Magnesium 1.7, replaced  Macrocytic Anemia -iron 92, Ferritin 2258 (likely APR) -Continue to monitor CBC -No signs of bleeding -Hb stable 10  Coagulopathy -Secondary to the above  -Patient was given Vit K 10mg  (2 doses) for INR of 1.75  Leukocytosis -Likely secondary to solumedrol, started on 12/30/2014 -Currently no source of infection, afebrile -Continue to monitor CBC  Code Status: Full, spoke with patient's wife and sister at bedside.  Wife having  a difficult time, but is considering DNR status.    Family Communication: Wife at bedside  Disposition Plan: Admitted.   Time Spent in minutes   30 minutes  Procedures  None  Consults   Gastroenterology  DVT Prophylaxis  SCDs  Lab Results  Component Value Date   PLT 182 12/31/2014    Medications  Scheduled Meds: . antiseptic oral rinse  7 mL Mouth Rinse BID  . feeding  supplement (ENSURE ENLIVE)  237 mL Oral BID BM  . folic acid  1 mg Oral Daily  . lactulose  300 mL Rectal BID  . LORazepam  0.25 mg Intravenous Q4H  . LORazepam  0-4 mg Oral Q12H  . methylPREDNISolone (SOLU-MEDROL) injection  32 mg Intravenous Daily  . multivitamin with minerals  1 tablet Oral Daily  . pantoprazole (PROTONIX) IV  40 mg Intravenous Q24H  . sodium chloride  3 mL Intravenous Q12H  . thiamine  100 mg Oral Daily   Or  . thiamine  100 mg Intravenous Daily   Continuous Infusions: . 0.9 % NaCl with KCl 20 mEq / L 75 mL/hr at 12/31/14 0624   PRN Meds:.alum & mag hydroxide-simeth, LORazepam **OR** LORazepam, morphine injection, ondansetron **OR** ondansetron (ZOFRAN) IV, traZODone  Antibiotics    Anti-infectives    None        Subjective:   Franklin Sloan seen and examined today.  Patient currently somnolent.  Moves extremities sporadically. Not responsive.   Objective:   Filed Vitals:   12/30/14 1300 12/30/14 1500 12/30/14 2158 12/31/14 0617  BP: 100/68  109/64 105/67  Pulse: 113 114 106 99  Temp: 98.6 F (37 C)  97.4 F (36.3 C) 97.5 F (36.4 C)  TempSrc: Axillary  Axillary Axillary  Resp: Height:      Weight:    85.866 kg (189 lb 4.8 oz)  SpO2: 90%  88% 91%    Wt Readings from Last 3 Encounters:  12/31/14 85.866 kg (189 lb 4.8 oz)     Intake/Output Summary (Last 24 hours) at 12/31/14 1107 Last data filed at 12/31/14 0503  Gross per 24 hour  Intake    400 ml  Output    775 ml  Net   -375 ml    Exam  General: Well developed, somnolent, jaundice  HEENT: NCAT, Sceral icterus,  mucous membranes moist.   Cardiovascular: normal S1/S2, RRR  Respiratory: Clear to auscultation, tachypneic  Abdomen: Soft, nontender, distended,+HSM  Extremities: warm dry without cyanosis clubbing or edema, moves ext sporadically   Data Review   Micro Results No results found for this or any previous visit (from the past 240 hour(s)).  Radiology  Reports Dg Chest 2 View  12/28/2014   CLINICAL DATA:  59 year old male with vomiting and cough for 1 week. Jaundice. Initial encounter.  EXAM: CHEST  2 VIEW  COMPARISON:  None.  FINDINGS: Low lung volumes. Mild crowding of markings at both lung bases. Normal cardiac size and mediastinal contours. Visualized tracheal air column is within normal limits. No pneumothorax, pulmonary edema or pleural effusion. No acute osseous abnormality identified.  IMPRESSION: Low lung volumes, otherwise no acute cardiopulmonary abnormality.   Electronically Signed   By: Odessa Fleming M.D.   On: 12/28/2014 09:35   Ct Abdomen Pelvis W Contrast  12/28/2014   CLINICAL DATA:  59 year old male with new onset jaundice since last week. Nausea vomiting and cold like illness symptoms for 3 weeks. Confusion today. Initial encounter.  EXAM:  CT ABDOMEN AND PELVIS WITH CONTRAST  TECHNIQUE: Multidetector CT imaging of the abdomen and pelvis was performed using the standard protocol following bolus administration of intravenous contrast.  CONTRAST:  100mL OMNIPAQUE IOHEXOL 300 MG/ML  SOLN  COMPARISON:  Abdomen ultrasound 0857 hours today. Chest radiographs 0918 hours today.  FINDINGS: Atelectasis at both lung bases. Mostly calcified granuloma in the right costophrenic angle. No pericardial or pleural effusion.  No acute osseous abnormality identified.  Small to moderate volume of pelvic ascites. Small fat containing inguinal hernia with trace fluid.  Space of Retzius varices. Otherwise negative bladder. Negative distal colon.  Sigmoid and left colon diverticulosis. Mild wall thickening of the 8 transverse colon and right colon probably is reactive. Normal appendix. No dilated small bowel, but there are thickened loops of small bowel in the left abdomen (series 2, image 73). Negative stomach. Mild duodenum all thickening and small second portion duodenum diverticulum on series 2, image 47.  Splenomegaly, splenic volume calculated at 1,206 mL (normal  splenic volume range 83 - 412 mL). Small to medium caliber varices at the root of the mesentery and tracking within the small bowel mesentery. Recannulized paraumbilical vein and small caliber are ventral abdominal wall varices. Small volume abdominal ascites.  Negative pancreas and adrenal glands. Renal enhancement and contrast excretion within normal limits. Aortoiliac calcified atherosclerosis noted. Major arterial structures are patent.  Portal venous system is patent. Moderately decreased density throughout the liver with irregular liver contour. No discrete liver mass. Moderately distended gallbladder with dependent gallstones. No dilatation of the CBD. No dilated intrahepatic biliary tree.  No lymphadenopathy. Small volume of ascites along the distal thoracic esophagus.  IMPRESSION: 1. Hepatic steatosis and cirrhosis with portal venous hypertension including splenomegaly, varices, ascites. No discrete liver mass is identified. 2. Moderately distended gallbladder with cholelithiasis, but no intra- or extrahepatic biliary ductal dilatation. 3. Reactive appearing small and large bowel wall thickening. 4. Pulmonary atelectasis.   Electronically Signed   By: Odessa FlemingH  Hall M.D.   On: 12/28/2014 11:23   [place] Abdomen Limited  12/28/2014   CLINICAL DATA:  Jaundice, right upper quadrant discomfort for the past 3 weeks; history of alcohol abuse, nausea, vomiting, and confusion.  EXAM: US ABDOMEN LIMITED - RIGHT UPPER QUADRANT  COMPARISON:  None  FINDINGS: Gallbladder:  The gallbladder is distended and contains echogenic material likely inspissated bile or sludge. The gallbladder wall is thickened to 7 mm. There is no intramural fluid or positive sonographic Murphy's sign. There is adjacent ascites.  Common bile duct:  Diameter: 5 mm  Liver:  There is marked attenuation of the ultrasound beam by the hepatic echotexture. There is no discrete mass or ductal dilation but evaluation is limited. No focal lesion identified. Within  normal limits in parenchymal echogenicity.  IMPRESSION: 1. The gallbladder is mildly distended and the lumen largely filled with echogenic material which may reflect sludge or echogenic bile. There is gallbladder wall thickening. No discrete gallbladder mass is demonstrated but an intraluminal mass could mimic the findings here. 2. Increased hepatic echotexture with poor definition of the parenchymal architecture. 3. CT scanning of the abdomen is recommended for further evaluation of the liver, gallbladder, and pancreas.   Electronically Signed   By: David  SwazilandJordan M.D.   On: 12/28/2014 09:20    CBC  Recent Labs Lab 12/28/14 0800 12/29/14 0617 12/30/14 0654 12/31/14 0636  WBC 11.7* 8.9 10.7* 13.3*  HGB 10.7* 9.5* 9.4* 10.0*  HCT 30.4* 27.0* 26.8* 29.0*  PLT 209 168  167 182  MCV 107.8* 108.9* 110.3* 111.5*  MCH 37.9* 38.3* 38.7* 38.5*  MCHC 35.2 35.2 35.1 34.5  RDW 18.1* 18.2* 18.5* 18.9*  LYMPHSABS 1.0  --   --   --   MONOABS 1.0  --   --   --   EOSABS 0.0  --   --   --   BASOSABS 0.0  --   --   --     Chemistries   Recent Labs Lab 12/28/14 0800 12/28/14 1414 12/29/14 0613 12/30/14 0654 12/31/14 0636  NA 127*  --  131* 134* 138  K 2.7*  --  2.8* 3.2* 3.8  CL 87*  --  94* 101 107  CO2 26  --  GLUCOSE 113*  --  94 106* 134*  BUN 25*  --  23 21 24*  CREATININE 0.80  --  0.42* 0.44* 0.41*  CALCIUM 8.2*  --  8.0* 7.8* 8.2*  MG  --  1.7  --   --   --   AST 153*  --  156* 141* 131*  ALT 31  --  28 28 35  ALKPHOS 156*  --  136* 113 120*  BILITOT 25.8*  --  25.5* 26.3* 27.4*   ------------------------------------------------------------------------------------------------------------------ estimated creatinine clearance is 107.2 mL/min (by C-G formula based on Cr of 0.41). ------------------------------------------------------------------------------------------------------------------ No results for input(s): HGBA1C in the last 72  hours. ------------------------------------------------------------------------------------------------------------------ No results for input(s): CHOL, HDL, LDLCALC, TRIG, CHOLHDL, LDLDIRECT in the last 72 hours. ------------------------------------------------------------------------------------------------------------------  Recent Labs  12/28/14 1414  TSH 1.136   ------------------------------------------------------------------------------------------------------------------  Recent Labs  12/28/14 1414  VITAMINB12 805  FOLATE 0.8*  FERRITIN 2258*  TIBC NOT CALC  IRON 92  RETICCTPCT 3.3*    Coagulation profile  Recent Labs Lab 12/28/14 0800 12/29/14 1041 12/30/14 0654 12/31/14 0858  INR 1.54* 1.82* 1.94* 1.75*    No results for input(s): DDIMER in the last 72 hours.  Cardiac Enzymes No results for input(s): CKMB, TROPONINI, MYOGLOBIN in the last 168 hours.  Invalid input(s): CK ------------------------------------------------------------------------------------------------------------------ Invalid input(s): POCBNP    MIKHAIL, MARYANN D.O. on 12/31/2014 at 11:07 AM  Between 7am to 7pm - Pager - [phone]  After 7pm go to [email or web address] - password [email or web address]  And look for the night coverage person covering for me after hours  [facility] Office  [phone]   Addendum:  Dr. Catha Gosselin informed me that family has elected DNR status for patient and since he is out of the hospital at this time, she has requested that I place a DNR order on the chart.   Franklin Sloan

## 2014-12-31 NOTE — Progress Notes (Signed)
Subjective: He is overall about the same. His wife is struggling with a decision about DO NOT RESUSCITATE.  Objective: Vital signs in last 24 hours: Temp:  [97.4 F (36.3 C)-98.6 F (37 C)] 97.5 F (36.4 C) (04/23 0617) Pulse Rate:  [99-114] 99 (04/23 0617) Resp:  [20-24] 22 (04/23 0617) BP: (100-109)/(64-68) 105/67 mmHg (04/23 0617) SpO2:  [88 %-91 %] 91 % (04/23 0617) Weight:  [85.866 kg (189 lb 4.8 oz)] 85.866 kg (189 lb 4.8 oz) (04/23 0617) Weight change: 0.68 kg (1 lb 8 oz) Last BM Date: 12/28/14  Intake/Output from previous day: 04/22 0701 - 04/23 0700 In: 400 [IV Piggyback:400] Out: 775 [Urine:775]  PHYSICAL EXAM General appearance: He is poorly responsive but does open his eyes. He is markedly jaundiced Resp: clear to auscultation bilaterally Cardio: regular rate and rhythm, S1, S2 normal, no murmur, click, rub or gallop GI: Distended but no tenderness. Extremities: extremities normal, atraumatic, no cyanosis or edema  Lab Results:  Results for orders placed or performed during the hospital encounter of 12/28/14 (from the past 48 hour(s))  Protime-INR     Status: Abnormal   Collection Time: 12/29/14 10:41 AM  Result Value Ref Range   Prothrombin Time 21.2 (H) 11.6 - 15.2 seconds   INR 1.82 (H) 0.00 - 1.49  Ammonia     Status: Abnormal   Collection Time: 12/29/14 11:20 AM  Result Value Ref Range   Ammonia 64 (H) 11 - 32 umol/L  Protime-INR     Status: Abnormal   Collection Time: 12/30/14  6:54 AM  Result Value Ref Range   Prothrombin Time 22.4 (H) 11.6 - 15.2 seconds   INR 1.94 (H) 0.00 - 1.49  CBC     Status: Abnormal   Collection Time: 12/30/14  6:54 AM  Result Value Ref Range   WBC 10.7 (H) 4.0 - 10.5 K/uL   RBC 2.43 (L) 4.22 - 5.81 MIL/uL   Hemoglobin 9.4 (L) 13.0 - 17.0 g/dL   HCT 26.8 (L) 39.0 - 52.0 %   MCV 110.3 (H) 78.0 - 100.0 fL   MCH 38.7 (H) 26.0 - 34.0 pg   MCHC 35.1 30.0 - 36.0 g/dL   RDW 18.5 (H) 11.5 - 15.5 %   Platelets 167 150 - 400  K/uL  Comprehensive metabolic panel     Status: Abnormal   Collection Time: 12/30/14  6:54 AM  Result Value Ref Range   Sodium 134 (L) 135 - 145 mmol/L   Potassium 3.2 (L) 3.5 - 5.1 mmol/L   Chloride 101 96 - 112 mmol/L   CO2 24 19 - 32 mmol/L   Glucose, Bld 106 (H) 70 - 99 mg/dL   BUN 21 6 - 23 mg/dL   Creatinine, Ser 0.44 (L) 0.50 - 1.35 mg/dL   Calcium 7.8 (L) 8.4 - 10.5 mg/dL   Total Protein 5.4 (L) 6.0 - 8.3 g/dL   Albumin 1.8 (L) 3.5 - 5.2 g/dL   AST 141 (H) 0 - 37 U/L   ALT 28 0 - 53 U/L   Alkaline Phosphatase 113 39 - 117 U/L   Total Bilirubin 26.3 (HH) 0.3 - 1.2 mg/dL    Comment: CRITICAL RESULT CALLED TO, READ BACK BY AND VERIFIED WITH: MAYS,J AT 7:40AM ON 12/30/14 BY FESTERMAN,C    GFR calc non Af Amer >90 >90 mL/min   GFR calc Af Amer >90 >90 mL/min    Comment: (NOTE) The eGFR has been calculated using the CKD EPI equation. This calculation has  not been validated in all clinical situations. eGFR's persistently <90 mL/min signify possible Chronic Kidney Disease.    Anion gap 9 5 - 15  Ammonia     Status: Abnormal   Collection Time: 12/30/14  6:54 AM  Result Value Ref Range   Ammonia 45 (H) 11 - 32 umol/L  Ammonia     Status: Abnormal   Collection Time: 12/31/14  6:31 AM  Result Value Ref Range   Ammonia 47 (H) 11 - 32 umol/L  CBC     Status: Abnormal   Collection Time: 12/31/14  6:36 AM  Result Value Ref Range   WBC 13.3 (H) 4.0 - 10.5 K/uL   RBC 2.60 (L) 4.22 - 5.81 MIL/uL   Hemoglobin 10.0 (L) 13.0 - 17.0 g/dL   HCT 29.0 (L) 39.0 - 52.0 %   MCV 111.5 (H) 78.0 - 100.0 fL   MCH 38.5 (H) 26.0 - 34.0 pg   MCHC 34.5 30.0 - 36.0 g/dL   RDW 18.9 (H) 11.5 - 15.5 %   Platelets 182 150 - 400 K/uL  Comprehensive metabolic panel     Status: Abnormal   Collection Time: 12/31/14  6:36 AM  Result Value Ref Range   Sodium 138 135 - 145 mmol/L   Potassium 3.8 3.5 - 5.1 mmol/L   Chloride 107 96 - 112 mmol/L   CO2 23 19 - 32 mmol/L   Glucose, Bld 134 (H) 70 - 99  mg/dL   BUN 24 (H) 6 - 23 mg/dL   Creatinine, Ser 0.41 (L) 0.50 - 1.35 mg/dL   Calcium 8.2 (L) 8.4 - 10.5 mg/dL   Total Protein 5.7 (L) 6.0 - 8.3 g/dL   Albumin 1.8 (L) 3.5 - 5.2 g/dL   AST 131 (H) 0 - 37 U/L   ALT 35 0 - 53 U/L   Alkaline Phosphatase 120 (H) 39 - 117 U/L   Total Bilirubin 27.4 (HH) 0.3 - 1.2 mg/dL    Comment: CRITICAL RESULT CALLED TO, READ BACK BY AND VERIFIED WITH: MARTIN M. AT 0712A ON 132440 BY THOMPSON S.    GFR calc non Af Amer >90 >90 mL/min   GFR calc Af Amer >90 >90 mL/min    Comment: (NOTE) The eGFR has been calculated using the CKD EPI equation. This calculation has not been validated in all clinical situations. eGFR's persistently <90 mL/min signify possible Chronic Kidney Disease.    Anion gap 8 5 - 15  Protime-INR     Status: Abnormal   Collection Time: 12/31/14  8:58 AM  Result Value Ref Range   Prothrombin Time 20.6 (H) 11.6 - 15.2 seconds   INR 1.75 (H) 0.00 - 1.49    ABGS No results for input(s): PHART, PO2ART, TCO2, HCO3 in the last 72 hours.  Invalid input(s): PCO2 CULTURES No results found for this or any previous visit (from the past 240 hour(s)). Studies/Results: No results found.  Medications:  Prior to Admission:  Prescriptions prior to admission  Medication Sig Dispense Refill Last Dose  . dextromethorphan-guaiFENesin (ROBITUSSIN-DM) 10-100 MG/5ML liquid Take 30 mLs by mouth every 4 (four) hours as needed for cough.   12/27/2014 at Unknown time  . Pseudoeph-Doxylamine-DM-APAP (NYQUIL MULTI-SYMPTOM PO) Take 30 mLs by mouth daily.   12/27/2014 at Unknown time   Scheduled: . antiseptic oral rinse  7 mL Mouth Rinse BID  . feeding supplement (ENSURE ENLIVE)  237 mL Oral BID BM  . folic acid  1 mg Oral Daily  . lactulose  300 mL Rectal BID  . LORazepam  0.25 mg Intravenous Q4H  . LORazepam  0-4 mg Oral Q12H  . methylPREDNISolone (SOLU-MEDROL) injection  32 mg Intravenous Daily  . multivitamin with minerals  1 tablet Oral Daily   . pantoprazole (PROTONIX) IV  40 mg Intravenous Q24H  . sodium chloride  3 mL Intravenous Q12H  . thiamine  100 mg Oral Daily   Or  . thiamine  100 mg Intravenous Daily   Continuous: . 0.9 % NaCl with KCl 20 mEq / L 75 mL/hr at 12/31/14 0569   VXY:IAXK & mag hydroxide-simeth, LORazepam **OR** LORazepam, morphine injection, ondansetron **OR** ondansetron (ZOFRAN) IV, traZODone  Assesment: He has cirrhosis of the liver and acute on chronic liver failure. He is basically unchanged. Consultation yesterday regarding the need for airway protection. His prognosis is so poor that I would not be in favor of intubation. Principal Problem:   Cirrhosis Active Problems:   Ascites   Hyponatremia   Hypokalemia   Anemia   Cholelithiasis   Hyperbilirubinemia   Portal hypertension   ETOH abuse   Elevated alkaline phosphatase level   Alcoholic cirrhosis of liver with ascites    Plan: Continue supportive care.    LOS: 3 days   Elvira Langston L 12/31/2014, 10:24 AM

## 2014-12-31 NOTE — Progress Notes (Signed)
CRITICAL VALUE ALERT  Critical value received:  Bilirubin 27.4  Date of notification:  12/31/14  Time of notification:  0717  Critical value read back:Yes.    Nurse who received alert:  Richardean ChimeraMarion Luwanna Brossman  MD notified (1st page):  MD aware of bilirubin  Time of first page:    MD notified (2nd page):  Time of second page:  Responding MD:    Time MD responded:

## 2014-12-31 NOTE — Progress Notes (Signed)
  Subjective:  Patient is unable to provide any history which is obtained from his wife Franklin Sloan who is at bedside. She states patient was able to recognize family members yesterday.   Objective: Blood pressure 105/67, pulse 99, temperature 97.5 F (36.4 C), temperature source Axillary, resp. rate 22, height 5\' 11"  (1.803 m), weight 189 lb 4.8 oz (85.866 kg), SpO2 91 %. Patient is unresponsive to vocal stimuli. He is jaundiced. Pupils are equal and reactive to light. No neck masses or thyromegaly noted. Cardiac exam with regular rhythm normal S1 and S2. No murmur or gallop noted. Lungs are clear to auscultation. Abdomen is full but soft and nontender. Liver is palpable. No LE edema or clubbing noted.  Labs/studies Results:   Recent Labs  12/29/14 0617 12/30/14 0654 12/31/14 0636  WBC 8.9 10.7* 13.3*  HGB 9.5* 9.4* 10.0*  HCT 27.0* 26.8* 29.0*  PLT 168 167 182    BMET   Recent Labs  12/29/14 0613 12/30/14 0654 12/31/14 0636  NA 131* 134* 138  K 2.8* 3.2* 3.8  CL 94* 101 107  CO2 28 24 23   GLUCOSE 94 106* 134*  BUN 23 21 24*  CREATININE 0.42* 0.44* 0.41*  CALCIUM 8.0* 7.8* 8.2*    LFT   Recent Labs  12/29/14 0613 12/30/14 0654 12/31/14 0636  PROT 5.7* 5.4* 5.7*  ALBUMIN 2.0* 1.8* 1.8*  AST 156* 141* 131*  ALT 28 28 35  ALKPHOS 136* 113 120*  BILITOT 25.5* 26.3* 27.4*    PT/INR   Recent Labs  12/30/14 0654 12/31/14 0858  LABPROT 22.4* 20.6*  INR 1.94* 1.75*     Assessment:   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 He remains with severe cholestasis. His INR has corrected from 1.94 to  1.75 in the last 24 hours.  Overall prognosis very poor and only hope is that his renal function is preserved.  He is on IV Solu-Medrol. Mild leukocytosis could be secondary to steroid or underlying alcoholic hepatitis. Oral intake is 0 and he is not a candidate for parenteral nutrition given severe liver  disease. Patient is not a candidate for liver transplant.  Recommendations;  Continue supportive therapy. Will repeat lab tomorrow morning.

## 2015-01-01 LAB — COMPREHENSIVE METABOLIC PANEL
ALBUMIN: 2 g/dL — AB (ref 3.5–5.2)
ALK PHOS: 129 U/L — AB (ref 39–117)
ALT: 52 U/L (ref 0–53)
AST: 175 U/L — AB (ref 0–37)
Anion gap: 10 (ref 5–15)
BILIRUBIN TOTAL: 31 mg/dL — AB (ref 0.3–1.2)
BUN: 27 mg/dL — AB (ref 6–23)
CO2: 20 mmol/L (ref 19–32)
Calcium: 8.6 mg/dL (ref 8.4–10.5)
Chloride: 115 mmol/L — ABNORMAL HIGH (ref 96–112)
Glucose, Bld: 110 mg/dL — ABNORMAL HIGH (ref 70–99)
Potassium: 3.8 mmol/L (ref 3.5–5.1)
Sodium: 145 mmol/L (ref 135–145)
Total Protein: 6.1 g/dL (ref 6.0–8.3)

## 2015-01-01 LAB — CBC
HCT: 33.3 % — ABNORMAL LOW (ref 39.0–52.0)
Hemoglobin: 11.2 g/dL — ABNORMAL LOW (ref 13.0–17.0)
MCH: 38.8 pg — ABNORMAL HIGH (ref 26.0–34.0)
MCHC: 33.6 g/dL (ref 30.0–36.0)
MCV: 115.2 fL — AB (ref 78.0–100.0)
Platelets: 219 10*3/uL (ref 150–400)
RBC: 2.89 MIL/uL — ABNORMAL LOW (ref 4.22–5.81)
RDW: 19.4 % — AB (ref 11.5–15.5)
WBC: 17.4 10*3/uL — ABNORMAL HIGH (ref 4.0–10.5)

## 2015-01-01 LAB — PROTIME-INR
INR: 1.73 — ABNORMAL HIGH (ref 0.00–1.49)
Prothrombin Time: 20.4 seconds — ABNORMAL HIGH (ref 11.6–15.2)

## 2015-01-01 MED ORDER — DEXTROSE-NACL 5-0.45 % IV SOLN
INTRAVENOUS | Status: DC
  Administered 2015-01-01 – 2015-01-03 (×5): via INTRAVENOUS

## 2015-01-01 NOTE — Progress Notes (Signed)
He now has DO NOT RESUSCITATE status. I will plan to sign off at this point.  Thanks for allowing me to participate in his care

## 2015-01-01 NOTE — Progress Notes (Signed)
[facility]                                                                              Patient Demographics  Franklin Sloan, is a 59 y.o. male, DOB - [birth year], QMV:[id number]  Admit date - 12/28/2014   Admitting Physician Edsel Petrin, DO  Outpatient Primary MD for the patient is No primary care provider on file.  LOS - 4   Chief Complaint  Patient presents with  . Jaundice      HPI on 12/29/2014 by Ms. [name], NP Franklin Sloan is a 59 y.o. male with a past medical history that includes EtOH abuse presents to the emergency department from home with the chief complaint of jaundice. Initial evaluation reveals, cirrhosis, cholelithiasis, hepatic steatosis, hyperbilirubinemia anemia, portal hypertension. Information is obtained from the patient. He states he lives with his sister she brought him to the emergency room today for a 2 day history of nausea and dry heaving and jaundice. Associated symptoms include abdominal pain described as intermittent sharp located in the upper right quadrant, decreased by mouth intake and decreased energy. Patient reports he drinks 1/2 of a fifth to a whole fifth of vodka daily for the last 2 years. He reports 4 years prior that he stopped drinking altogether. He reports not noticing his own yellow color. He denies fever chills chest pain palpitations shortness of breath cough headache visual disturbances syncope or near-syncope. He denies dysuria hematuria frequency or urgency he denies diarrhea constipation melena. Workup in the emergency department significant for WBCs 11.7 hemoglobin 10.7 sodium 127 potassium 2.7 chloride 87 BUN 25 INR 1.5 for serum glucose 113. Abdominal ultrasound reveals gallbladder distended with possible sludge and wall thickening, CT of the abdomen pelvis reveals hepatic steatosis and cirrhosis with portal venous hypertension including splenomegaly varices and ascites, not really distended gallbladder with cholelithiasis but no  biliary ductal dilation, small and large bowel wall thickening, pulmonary atelectasis. In the emergency department he is afebrile hemodynamically stable and not hypoxic  Assessment & Plan   Jaundice/alcoholic cirrhosis/hepatitis/portal hypertension/Hepatic Encephalopathy -Patient a little more awake today.  -Found on CT of the abdomen -Gastroenterology consulted and aappreciated -Continue lactulose enema, PPI, solumedrol 32mg  IV daily -MELD Score upon admission >22, discriminat function > 40 -Patient is at high mortality risk, overall prognosis poor -Spoke with gastroenterology, unlikely to recover -Not a candidate for transplant  Hyperbilirubinemia -Secondary to above, continues to rise- today 31 -Continue to monitor CMP  Hyperammonemia -Ammonia level improving with lactulose enema  Alcohol abuse -Alcohol level 129 upon admission -Continue CIWA protocol   Ascites  -Secondary to above -Small amount per CT   Hyponatremia -resolved, with IVF, will continue to monitor BMP -Will switch to D5 1/2NS -TSH 1.136  Hypokalemia -Resolved, continue to monitor BMP and replace as needed -Magnesium 1.7, replaced  Macrocytic Anemia -iron 92, Ferritin 2258 (likely APR) -Continue to monitor CBC -No signs of bleeding -Hb stable 11.2  Coagulopathy -Secondary to the above  -Patient was given Vit K 10mg  (2 doses) for INR of 1.75  Leukocytosis -Likely secondary to solumedrol, started on 12/30/2014 -Currently no source of infection, afebrile -Continue to monitor CBC, increased 17.4  Code Status:  DNR  Family Communication: None at bedside at time of exam.   Disposition Plan: Admitted.   Time Spent in minutes   30 minutes  Procedures  None  Consults   Gastroenterology Pulmonology PCCM via phone  DVT Prophylaxis  SCDs  Lab Results  Component Value Date   PLT 219 01/01/2015    Medications  Scheduled Meds: . antiseptic oral rinse  7 mL Mouth Rinse BID  . feeding  supplement (ENSURE ENLIVE)  237 mL Oral BID BM  . folic acid  1 mg Oral Daily  . lactulose  300 mL Rectal BID  . LORazepam  0.25 mg Intravenous Q4H  . LORazepam  0-4 mg Oral Q12H  . methylPREDNISolone (SOLU-MEDROL) injection  32 mg Intravenous Daily  . multivitamin with minerals  1 tablet Oral Daily  . pantoprazole (PROTONIX) IV  40 mg Intravenous Q24H  . sodium chloride  3 mL Intravenous Q12H  . thiamine  100 mg Oral Daily   Or  . thiamine  100 mg Intravenous Daily   Continuous Infusions: . dextrose 5 % and 0.45% NaCl 75 mL/hr at 01/01/15 0859   PRN Meds:.alum & mag hydroxide-simeth, morphine injection, ondansetron **OR** ondansetron (ZOFRAN) IV, traZODone  Antibiotics    Anti-infectives    None        Subjective:   Franklin Sloan seen and examined today.  Patient more alert this morning.  Can answer simple questions.  Denies any pain, shortness of breath or chest pain.   Objective:   Filed Vitals:   12/31/14 0617 12/31/14 1449 12/31/14 2119 01/01/15 0552  BP: 105/67 115/71 118/79 122/79  Pulse: 99 100 103 99  Temp: 97.5 F (36.4 C) 97.6 F (36.4 C) 97.5 F (36.4 C) 97.5 F (36.4 C)  TempSrc: Axillary Oral Oral Oral  Resp: 22 24 24 24   Height:      Weight: 85.866 kg (189 lb 4.8 oz)   83.4 kg (183 lb 13.8 oz)  SpO2: 91% 89% 95% 94%    Wt Readings from Last 3 Encounters:  01/01/15 83.4 kg (183 lb 13.8 oz)     Intake/Output Summary (Last 24 hours) at 01/01/15 1151 Last data filed at 01/01/15 0630  Gross per 24 hour  Intake 1778.75 ml  Output   1350 ml  Net 428.75 ml    Exam  General: Well developed, somnolent, jaundice  HEENT: NCAT, Sceral icterus,  mucous membranes moist.   Cardiovascular: normal S1/S2, RRR, no murmurs  Respiratory: Clear to auscultation  Abdomen: Soft, nontender, distended,+HSM  Extremities: warm dry without cyanosis clubbing or edema  Data Review   Micro Results No results found for this or any previous visit (from the  past 240 hour(s)).  Radiology Reports Dg Chest 2 View  12/28/2014   CLINICAL DATA:  59 year old male with vomiting and cough for 1 week. Jaundice. Initial encounter.  EXAM: CHEST  2 VIEW  COMPARISON:  None.  FINDINGS: Low lung volumes. Mild crowding of markings at both lung bases. Normal cardiac size and mediastinal contours. Visualized tracheal air column is within normal limits. No pneumothorax, pulmonary edema or pleural effusion. No acute osseous abnormality identified.  IMPRESSION: Low lung volumes, otherwise no acute cardiopulmonary abnormality.   Electronically Signed   By: Odessa FlemingH  Hall M.D.   On: 12/28/2014 09:35   Ct Abdomen Pelvis W Contrast  12/28/2014   CLINICAL DATA:  59 year old male with new onset jaundice since last week. Nausea vomiting and cold like illness symptoms for 3 weeks. Confusion today.  Initial encounter.  EXAM: CT ABDOMEN AND PELVIS WITH CONTRAST  TECHNIQUE: Multidetector CT imaging of the abdomen and pelvis was performed using the standard protocol following bolus administration of intravenous contrast.  CONTRAST:  OMNIPAQUE IOHEXOL 300 MG/ML  SOLN  COMPARISON:  Abdomen ultrasound 0857 hours today. Chest radiographs 0918 hours today.  FINDINGS: Atelectasis at both lung bases. Mostly calcified granuloma in the right costophrenic angle. No pericardial or pleural effusion.  No acute osseous abnormality identified.  Small to moderate volume of pelvic ascites. Small fat containing inguinal hernia with trace fluid.  Space of Retzius varices. Otherwise negative bladder. Negative distal colon.  Sigmoid and left colon diverticulosis. Mild wall thickening of the 8 transverse colon and right colon probably is reactive. Normal appendix. No dilated small bowel, but there are thickened loops of small bowel in the left abdomen (series 2, image 73). Negative stomach. Mild duodenum all thickening and small second portion duodenum diverticulum on series 2, image 47.  Splenomegaly, splenic volume  calculated at 1,206 mL (normal splenic volume range 83 - 412 mL). Small to medium caliber varices at the root of the mesentery and tracking within the small bowel mesentery. Recannulized paraumbilical vein and small caliber are ventral abdominal wall varices. Small volume abdominal ascites.  Negative pancreas and adrenal glands. Renal enhancement and contrast excretion within normal limits. Aortoiliac calcified atherosclerosis noted. Major arterial structures are patent.  Portal venous system is patent. Moderately decreased density throughout the liver with irregular liver contour. No discrete liver mass. Moderately distended gallbladder with dependent gallstones. No dilatation of the CBD. No dilated intrahepatic biliary tree.  No lymphadenopathy. Small volume of ascites along the distal thoracic esophagus.  IMPRESSION: 1. Hepatic steatosis and cirrhosis with portal venous hypertension including splenomegaly, varices, ascites. No discrete liver mass is identified. 2. Moderately distended gallbladder with cholelithiasis, but no intra- or extrahepatic biliary ductal dilatation. 3. Reactive appearing small and large bowel wall thickening. 4. Pulmonary atelectasis.   Electronically Signed   By: Odessa Fleming M.D.   On: 12/28/2014 11:23   US Abdomen Limited  12/28/2014   CLINICAL DATA:  Jaundice, right upper quadrant discomfort for the past 3 weeks; history of alcohol abuse, nausea, vomiting, and confusion.  EXAM: US ABDOMEN LIMITED - RIGHT UPPER QUADRANT  COMPARISON:  None  FINDINGS: Gallbladder:  The gallbladder is distended and contains echogenic material likely inspissated bile or sludge. The gallbladder wall is thickened to 7 mm. There is no intramural fluid or positive sonographic Murphy's sign. There is adjacent ascites.  Common bile duct:  Diameter: 5 mm  Liver:  There is marked attenuation of the ultrasound beam by the hepatic echotexture. There is no discrete mass or ductal dilation but evaluation is limited. No  focal lesion identified. Within normal limits in parenchymal echogenicity.  IMPRESSION: 1. The gallbladder is mildly distended and the lumen largely filled with echogenic material which may reflect sludge or echogenic bile. There is gallbladder wall thickening. No discrete gallbladder mass is demonstrated but an intraluminal mass could mimic the findings here. 2. Increased hepatic echotexture with poor definition of the parenchymal architecture. 3. CT scanning of the abdomen is recommended for further evaluation of the liver, gallbladder, and pancreas.   Electronically Signed   By: David  [place] M.D.   On: 12/28/2014 09:20    CBC  Recent Labs Lab 12/28/14 0800 12/29/14 0617 12/30/14 0654 12/31/14 0636 01/01/15 0610  WBC 11.7* 8.9 10.7* 13.3* 17.4*  HGB 10.7* 9.5* 9.4* 10.0* 11.2*  HCT  30.4* 27.0* 26.8* 29.0* 33.3*  PLT 209 168 167 182 219  MCV 107.8* 108.9* 110.3* 111.5* 115.2*  MCH 37.9* 38.3* 38.7* 38.5* 38.8*  MCHC 35.2 35.2 35.1 34.5 33.6  RDW 18.1* 18.2* 18.5* 18.9* 19.4*  LYMPHSABS 1.0  --   --   --   --   MONOABS 1.0  --   --   --   --   EOSABS 0.0  --   --   --   --   BASOSABS 0.0  --   --   --   --     Chemistries   Recent Labs Lab 12/28/14 0800 12/28/14 1414 12/29/14 0613 12/30/14 0654 12/31/14 0636 01/01/15 0610  NA 127*  --  131* 134* 138 145  K 2.7*  --  2.8* 3.2* 3.8 3.8  CL 87*  --  94* 101 107 115*  CO2 26  --  GLUCOSE 113*  --  94 106* 134* 110*  BUN 25*  --  23 21 24* 27*  CREATININE 0.80  --  0.42* 0.44* 0.41* <0.30*  CALCIUM 8.2*  --  8.0* 7.8* 8.2* 8.6  MG  --  1.7  --   --   --   --   AST 153*  --  156* 141* 131* 175*  ALT 31  --  28 28 35 52  ALKPHOS 156*  --  136* 113 120* 129*  BILITOT 25.8*  --  25.5* 26.3* 27.4* 31.0*   ------------------------------------------------------------------------------------------------------------------ CrCl cannot be calculated (Patient has no serum creatinine result on  file.). ------------------------------------------------------------------------------------------------------------------ No results for input(s): HGBA1C in the last 72 hours. ------------------------------------------------------------------------------------------------------------------ No results for input(s): CHOL, HDL, LDLCALC, TRIG, CHOLHDL, LDLDIRECT in the last 72 hours. ------------------------------------------------------------------------------------------------------------------ No results for input(s): TSH, T4TOTAL, T3FREE, THYROIDAB in the last 72 hours.  Invalid input(s): FREET3 ------------------------------------------------------------------------------------------------------------------ No results for input(s): VITAMINB12, FOLATE, FERRITIN, TIBC, IRON, RETICCTPCT in the last 72 hours.  Coagulation profile  Recent Labs Lab 12/28/14 0800 12/29/14 1041 12/30/14 0654 12/31/14 0858 01/01/15 0610  INR 1.54* 1.82* 1.94* 1.75* 1.73*    No results for input(s): DDIMER in the last 72 hours.  Cardiac Enzymes No results for input(s): CKMB, TROPONINI, MYOGLOBIN in the last 168 hours.  Invalid input(s): CK ------------------------------------------------------------------------------------------------------------------ Invalid input(s): POCBNP    Jahmya Onofrio D.O. on 01/01/2015 at 11:51 AM  Between 7am to 7pm - Pager - [phone]  After 7pm go to [email or web address] - password [email or web address]  And look for the night coverage person covering for me after hours  [facility] Office  [phone] Edsel Petrin

## 2015-01-01 NOTE — Progress Notes (Signed)
  Subjective:  Patient is not able to provide any history which was provided his sister Ms. Sharol HarnessSimmons was at bedside. She states he has been restless since Foleys catheter was placed. She feels his cough is stronger. He did take a few sips of orange juice.  Objective: Blood pressure 126/74, pulse 103, temperature 97.6 F (36.4 C), temperature source Oral, resp. rate 24, height 5\' 11"  (1.803 m), weight 183 lb 13.8 oz (83.4 kg), SpO2 93 %. Patient is drowsy but does respond to vocal stimuli. He remains deeply jaundiced. Abdomen is distended but very soft and nontender. No LE edema noted.  He has 550 mL of dark urine in bag.  Labs/studies Results:   Recent Labs  12/30/14 0654 12/31/14 0636 01/01/15 0610  WBC 10.7* 13.3* 17.4*  HGB 9.4* 10.0* 11.2*  HCT 26.8* 29.0* 33.3*  PLT 167 182 219    BMET   Recent Labs  12/30/14 0654 12/31/14 0636 01/01/15 0610  NA 134* 138 145  K 3.2* 3.8 3.8  CL 101 107 115*  CO2 24 23 20   GLUCOSE 106* 134* 110*  BUN 21 24* 27*  CREATININE 0.44* 0.41* <0.30*  CALCIUM 7.8* 8.2* 8.6    LFT   Recent Labs  12/30/14 0654 12/31/14 0636 01/01/15 0610  PROT 5.4* 5.7* 6.1  ALBUMIN 1.8* 1.8* 2.0*  AST 141* 131* 175*  ALT 28 35 52  ALKPHOS 113 120* 129*  BILITOT 26.3* 27.4* 31.0*    PT/INR   Recent Labs  12/31/14 0858 01/01/15 0610  LABPROT 20.6* 20.4*  INR 1.75* 1.73*     Assessment:  #1. Progressive intrahepatic cholestasis secondary to alcoholic hepatitis. Bilirubin keeps rising renal function remains preserved which is the only hopeful sign and overall condition. Leukocytosis most likely secondary to IV steroids and or liver disease. He does not appear to be septic. Patient has been made no code.  In addition reviewed with Ms. Sharol HarnessSimmons patient's sister who is at bedside.  Recommendations;  Continue supportive therapy.

## 2015-01-02 ENCOUNTER — Inpatient Hospital Stay (HOSPITAL_COMMUNITY): Payer: Medicaid Other

## 2015-01-02 DIAGNOSIS — K7011 Alcoholic hepatitis with ascites: Secondary | ICD-10-CM | POA: Insufficient documentation

## 2015-01-02 LAB — COMPREHENSIVE METABOLIC PANEL
ALK PHOS: 120 U/L — AB (ref 39–117)
ALT: 77 U/L — ABNORMAL HIGH (ref 0–53)
AST: 205 U/L — ABNORMAL HIGH (ref 0–37)
Albumin: 1.9 g/dL — ABNORMAL LOW (ref 3.5–5.2)
Anion gap: 8 (ref 5–15)
BUN: 26 mg/dL — ABNORMAL HIGH (ref 6–23)
CO2: 22 mmol/L (ref 19–32)
Calcium: 8.5 mg/dL (ref 8.4–10.5)
Chloride: 114 mmol/L — ABNORMAL HIGH (ref 96–112)
Creatinine, Ser: <0.3 mg/dL — ABNORMAL LOW (ref 0.50–1.35)
Glucose, Bld: 127 mg/dL — ABNORMAL HIGH (ref 70–99)
POTASSIUM: 3.7 mmol/L (ref 3.5–5.1)
Sodium: 144 mmol/L (ref 135–145)
TOTAL PROTEIN: 6.1 g/dL (ref 6.0–8.3)
Total Bilirubin: 32 mg/dL (ref 0.3–1.2)

## 2015-01-02 LAB — BODY FLUID CELL COUNT WITH DIFFERENTIAL
EOS FL: 0 %
Lymphs, Fluid: 32 %
MONOCYTE-MACROPHAGE-SEROUS FLUID: 27 % — AB (ref 50–90)
Neutrophil Count, Fluid: 41 % — ABNORMAL HIGH (ref 0–25)
WBC FLUID: 84 uL (ref 0–1000)

## 2015-01-02 LAB — GLUCOSE, SEROUS FLUID: Glucose, Fluid: 137 mg/dL

## 2015-01-02 LAB — LACTATE DEHYDROGENASE, PLEURAL OR PERITONEAL FLUID: LD, Fluid: 49 U/L — ABNORMAL HIGH (ref 3–23)

## 2015-01-02 LAB — PROTIME-INR
INR: 1.74 — ABNORMAL HIGH (ref 0.00–1.49)
Prothrombin Time: 20.5 seconds — ABNORMAL HIGH (ref 11.6–15.2)

## 2015-01-02 LAB — AMMONIA: AMMONIA: 33 umol/L — AB (ref 11–32)

## 2015-01-02 LAB — CBC
HCT: 35.1 % — ABNORMAL LOW (ref 39.0–52.0)
Hemoglobin: 11.3 g/dL — ABNORMAL LOW (ref 13.0–17.0)
MCH: 37.2 pg — ABNORMAL HIGH (ref 26.0–34.0)
MCHC: 32.2 g/dL (ref 30.0–36.0)
MCV: 115.5 fL — ABNORMAL HIGH (ref 78.0–100.0)
Platelets: 203 10*3/uL (ref 150–400)
RBC: 3.04 MIL/uL — ABNORMAL LOW (ref 4.22–5.81)
RDW: 19.6 % — ABNORMAL HIGH (ref 11.5–15.5)
WBC: 19.2 10*3/uL — AB (ref 4.0–10.5)

## 2015-01-02 LAB — PROTEIN, BODY FLUID: Total protein, fluid: <3 g/dL

## 2015-01-02 MED ORDER — PREDNISOLONE 15 MG/5ML PO SOLN
40.0000 mg | Freq: Every day | ORAL | Status: DC
  Administered 2015-01-03 – 2015-01-04 (×2): 40 mg via ORAL
  Filled 2015-01-02 (×4): qty 15

## 2015-01-02 NOTE — Progress Notes (Signed)
Lactulose enema given. Patient groaning, kicking, pushing, and hitting at nurses and techs during administration, verbal and tactile reassurance given. 1000 ml administered rectally and retained by patient. Patient cleaned and foam dressing to sacrum changed at this time. Barrier cream applied to moisture associated redness on bilateral buttocks.

## 2015-01-02 NOTE — Progress Notes (Addendum)
Nutrition Follow-up   INTERVENTION: Ensure Enlive BID   ProStat 30 ml TID (each 30 ml provides 100 kcal, 15 gr protein)   NUTRITION DIAGNOSIS: Inadequate oral intake related to cirrhosis as evidenced by po intake 0% x 5 days  Goal: Pt to progress diet to meet >/= 90% of their estimated nutrition needs;not met   Monitor:  Po intake and nutrition care, wts and lab results   Reason for Assessment: Malnutrition Screen   59 y.o. male  Admitting Dx: Cirrhosis  ASSESSMENT: Pt presents with jaundice. Hx of ETOH abuse and decreased oral intake prior to admission. He is not alert enough at this time to provide hx. CT with evidence of cirrhosis, splenomegaly, varices, and ascites (perihepatic).  Pt diet advanced yesterday but he is still not taking meals. His lunch is here and untouched. He is having paracentesis due to his ascites and there's a palliative consult pending. Pt has poor prognosis per MD. Will continue to follow.  Question if he is a candidate for alternate nutrition support measures if he continues to be unable to meet nutrition needs orally??  He meets criteria for severe malnutrition in the context of acute illness given his energy intake </= 50% for >/= 5 days and severe fluid accumulation. Labs: BUN-26, Creat. <0.30, glucose 127.  Height: Ht Readings from Last 1 Encounters:  12/28/14 5' 11" (1.803 m)    Weight: Wt Readings from Last 1 Encounters:  01/02/15 181 lb 14.1 oz (82.5 kg)    Ideal Body Weight: 172# (78 kg)  % Ideal Body Weight: 106%  Wt Readings from Last 10 Encounters:  01/02/15 181 lb 14.1 oz (82.5 kg)    Usual Body Weight: unknown  BMI:  Body mass index is 25.38 kg/(m^2). overweight  Estimated Nutritional Needs: Kcal: 3536-1443 Protein: 107-123 gr Fluid: 2.0-2.3 liters daily  Skin: jaundice   Diet Order: Diet regular Room service appropriate?: Yes; Fluid consistency:: Thin  EDUCATION NEEDS: -No education needs identified at this  time   Intake/Output Summary (Last 24 hours) at 01/02/15 1631 Last data filed at 01/02/15 1540  Gross per 24 hour  Intake 1591.25 ml  Output    525 ml  Net 1066.25 ml    Last BM: 12/28/14 -abdomen distended and pt is receiving lactulose enemas  Labs:   Recent Labs Lab 12/28/14 1414  12/31/14 0636 01/01/15 0610 01/02/15 0601  NA  --   < > 138 145 144  K  --   < > 3.8 3.8 3.7  CL  --   < > 107 115* 114*  CO2  --   < > _0 BUN  --   < > 24* 27* 26*  CREATININE  --   < > 0.41* <0.30* <0.30*  CALCIUM  --   < > 8.2* 8.6 8.5  MG 1.7  --   --   --   --   GLUCOSE  --   < > 134* 110* 127*  < > = values in this interval not displayed.  CBG (last 3)  No results for input(s): GLUCAP in the last 72 hours.  Scheduled Meds: . antiseptic oral rinse  7 mL Mouth Rinse BID  . feeding supplement (ENSURE ENLIVE)  237 mL Oral BID BM  . folic acid  1 mg Oral Daily  . lactulose  300 mL Rectal BID  . LORazepam  0.25 mg Intravenous Q4H  . multivitamin with minerals  1 tablet Oral Daily  . pantoprazole (PROTONIX)  IV  40 mg Intravenous Q24H  . [START ON 01/03/2015] prednisoLONE  40 mg Oral QAC breakfast  . sodium chloride  3 mL Intravenous Q12H  . thiamine  100 mg Oral Daily   Or  . thiamine  100 mg Intravenous Daily    Continuous Infusions: . dextrose 5 % and 0.45% NaCl 75 mL/hr at 01/02/15 1421    Past Medical History  Diagnosis Date  . Cirrhosis   . Hepatic steatosis   . Spleen enlarged   . Jaundice   . Hyperbilirubinemia   . Portal hypertension   . ETOH abuse   . Cholelithiasis   . Anemia   . Hypokalemia   . Hyponatremia   . Ascites   . Diverticulosis   . Inguinal hernia     Past Surgical History  Procedure Laterality Date  . Hernia repair      Colman Cater MS,RD,CSG,LDN Office: [phone] Pager: [phone]

## 2015-01-02 NOTE — Consult Note (Signed)
Meeting with family to discuss goals of care took place in the family room.  Family members include Wife (separated, not divorced) son, Trinna Postlex who came [place] on [employer] [place] from [employer], daughter, Chestine SporeClark and patient sister Franklin Sloan.   Nadine CountsBob has lived with his sister Franklin Sloan for about 2 years.  [place].  He has struggled with alcohol addiction for over 30 years according to family but had quit "cold [place]" for about 4 years before [place].  His sister Franklin Sloan is not able to determine exactly when he started drinking again as he had held a job and did not drink in public.  She recognized about 2 months ago by his physical appearance that he was drinking heavily but he was unwilling to discuss or seek any help.  History reveals that he was involuntarily admitted to behavioral health a few years back for attempted suicide.  At that time he was offered counseling and placed on anti-depressants which he refused to continue very shortly after discharge.  Very difficult social situation with complicated co-dependancy and addiction issues.    Transitions of care options discussed including palliative care philosophy, hospice and long term residential placement.  Meeting interrupted, patient going to IR for paracentesis.    Scheduled re-meet with family tomorrow 4/26 at 2pm.  Cindra PresumeSue Ellen Shiza Thelen, RN, MSN, [facility]

## 2015-01-02 NOTE — Progress Notes (Signed)
[facility]                                                                              Patient Demographics  Franklin Sloan, is a 59 y.o. male, DOB - 1956-04-01, ZOX:[id number]  Admit date - 12/28/2014   Admitting Physician Franklin Petrin, DO  Outpatient Primary MD for the patient is No primary care provider on file.  LOS - 5   Chief Complaint  Patient presents with  . Jaundice      HPI on 12/29/2014 by Ms. Franklin Smothers, NP Franklin Sloan is a 59 y.o. male with a past medical history that includes EtOH abuse presents to the emergency department from home with the chief complaint of jaundice. Initial evaluation reveals, cirrhosis, cholelithiasis, hepatic steatosis, hyperbilirubinemia anemia, portal hypertension. Information is obtained from the patient. He states he lives with his sister she brought him to the emergency room today for a 2 day history of nausea and dry heaving and jaundice. Associated symptoms include abdominal pain described as intermittent sharp located in the upper right quadrant, decreased by mouth intake and decreased energy. Patient reports he drinks 1/2 of a fifth to a whole fifth of vodka daily for the last 2 years. He reports 4 years prior that he stopped drinking altogether. He reports not noticing his own yellow color. He denies fever chills chest pain palpitations shortness of breath cough headache visual disturbances syncope or near-syncope. He denies dysuria hematuria frequency or urgency he denies diarrhea constipation melena. Workup in the emergency department significant for WBCs 11.7 hemoglobin 10.7 sodium 127 potassium 2.7 chloride 87 BUN 25 INR 1.5 for serum glucose 113. Abdominal ultrasound reveals gallbladder distended with possible sludge and wall thickening, CT of the abdomen pelvis reveals hepatic steatosis and cirrhosis with portal venous hypertension including splenomegaly varices and ascites, not really distended gallbladder with cholelithiasis but no  biliary ductal dilation, small and large bowel wall thickening, pulmonary atelectasis. In the emergency department he is afebrile hemodynamically stable and not hypoxic  Assessment & Plan   Jaundice/alcoholic cirrhosis/hepatitis/portal hypertension/Hepatic Encephalopathy -Patient more awake today -Found on CT of the abdomen -Gastroenterology consulted and aappreciated -Continue lactulose enema, PPI, solumedrol changed to prednisolone  daily  -MELD Score upon admission >22, discriminat function > 40 -Patient is at high mortality risk, overall prognosis poor -Spoke with gastroenterology, unlikely to recover -Not a candidate for transplant -Spoke with patient and wife, would like palliative care meeting  Hyperbilirubinemia -Secondary to above, continues to rise- today 32 -Continue to monitor CMP  Hyperammonemia -Ammonia level improving with lactulose enema -Ammonia 33  Alcohol abuse -Alcohol level 129 upon admission -Continue CIWA protocol   Ascites  -Secondary to above -Small amount per CT  -Increasing in size since original CT -Will consult IR for paracentesis  Hyponatremia -resolved, with IVF, will continue to monitor BMP -TSH 1.136  Hypokalemia -Resolved, continue to monitor BMP and replace as needed -Magnesium 1.7, replaced  Macrocytic Anemia -iron 92, Ferritin 2258 (likely APR) -Continue to monitor CBC -No signs of bleeding -Hb stable 11.2  Coagulopathy -Secondary to the above  -Patient was given Vit K  (2 doses) for INR of 1.74  Leukocytosis -Likely secondary to solumedrol,  started on 12/30/2014 -Currently no source of infection, afebrile -Continue to monitor CBC, increased 19.2  Code Status: DNR  Family Communication: Wife at bedside.  Disposition Plan: Admitted. Pending palliative care consult  Time Spent in minutes   30 minutes  Procedures  None  Consults   Gastroenterology Pulmonology PCCM via phone IR Palliative care  DVT  Prophylaxis  SCDs  Lab Results  Component Value Date   PLT 203 01/02/2015    Medications  Scheduled Meds: . antiseptic oral rinse  7 mL Mouth Rinse BID  . feeding supplement (ENSURE ENLIVE)  237 mL Oral BID BM  . folic acid  1 mg Oral Daily  . lactulose  300 mL Rectal BID  . LORazepam  0.25 mg Intravenous Q4H  . multivitamin with minerals  1 tablet Oral Daily  . pantoprazole (PROTONIX) IV  40 mg Intravenous Q24H  . [START ON 01/03/2015] prednisoLONE  40 mg Oral QAC breakfast  . sodium chloride  3 mL Intravenous Q12H  . thiamine  100 mg Oral Daily   Or  . thiamine  100 mg Intravenous Daily   Continuous Infusions: . dextrose 5 % and 0.45% NaCl 75 mL/hr at 01/02/15 0200   PRN Meds:.alum & mag hydroxide-simeth, morphine injection, ondansetron **OR** ondansetron (ZOFRAN) IV, traZODone  Antibiotics    Anti-infectives    None        Subjective:   Franklin Sloan seen and examined today.  Patient more alert this morning and can answer questions.  Denies chest pain, shortness of breath or abdominal pain.   Objective:   Filed Vitals:   01/01/15 1321 01/01/15 2137 01/02/15 0615 01/02/15 1123  BP: 126/74 127/79 123/81 113/69  Pulse: 103 100 104 106  Temp: 97.6 F (36.4 C)  97.5 F (36.4 C)   TempSrc: Oral  Oral   Resp: 24 24 24    Height:      Weight:   82.5 kg (181 lb 14.1 oz)   SpO2: 93% 93% 92%     Wt Readings from Last 3 Encounters:  01/02/15 82.5 kg (181 lb 14.1 oz)     Intake/Output Summary (Last 24 hours) at 01/02/15 1301 Last data filed at 01/02/15 1610  Gross per 24 hour  Intake 1591.25 ml  Output    525 ml  Net 1066.25 ml    Exam  General: Well developed, thin, jaundice  HEENT: NCAT, Sceral icterus,  mucous membranes moist.   Cardiovascular: normal S1/S2, RRR, no murmurs  Respiratory: Clear to auscultation  Abdomen: Soft, nontender, distended,+HSM  Extremities: warm dry without cyanosis clubbing or edema  Data Review   Micro Results No  results found for this or any previous visit (from the past 240 hour(s)).  Radiology Reports Dg Chest 2 View  12/28/2014   CLINICAL DATA:  59 year old male with vomiting and cough for 1 week. Jaundice. Initial encounter.  EXAM: CHEST  2 VIEW  COMPARISON:  None.  FINDINGS: Low lung volumes. Mild crowding of markings at both lung bases. Normal cardiac size and mediastinal contours. Visualized tracheal air column is within normal limits. No pneumothorax, pulmonary edema or pleural effusion. No acute osseous abnormality identified.  IMPRESSION: Low lung volumes, otherwise no acute cardiopulmonary abnormality.   Electronically Signed   By: Odessa Fleming M.D.   On: 12/28/2014 09:35   Ct Abdomen Pelvis W Contrast  12/28/2014   CLINICAL DATA:  59 year old male with new onset jaundice since last week. Nausea vomiting and cold like illness symptoms for 3 weeks.  Confusion today. Initial encounter.  EXAM: CT ABDOMEN AND PELVIS WITH CONTRAST  TECHNIQUE: Multidetector CT imaging of the abdomen and pelvis was performed using the standard protocol following bolus administration of intravenous contrast.  CONTRAST:  OMNIPAQUE IOHEXOL 300 MG/ML  SOLN  COMPARISON:  Abdomen ultrasound 0857 hours today. Chest radiographs 0918 hours today.  FINDINGS: Atelectasis at both lung bases. Mostly calcified granuloma in the right costophrenic angle. No pericardial or pleural effusion.  No acute osseous abnormality identified.  Small to moderate volume of pelvic ascites. Small fat containing inguinal hernia with trace fluid.  Space of Retzius varices. Otherwise negative bladder. Negative distal colon.  Sigmoid and left colon diverticulosis. Mild wall thickening of the 8 transverse colon and right colon probably is reactive. Normal appendix. No dilated small bowel, but there are thickened loops of small bowel in the left abdomen (series 2, image 73). Negative stomach. Mild duodenum all thickening and small second portion duodenum diverticulum  on series 2, image 47.  Splenomegaly, splenic volume calculated at 1,206 mL (normal splenic volume range 83 - 412 mL). Small to medium caliber varices at the root of the mesentery and tracking within the small bowel mesentery. Recannulized paraumbilical vein and small caliber are ventral abdominal wall varices. Small volume abdominal ascites.  Negative pancreas and adrenal glands. Renal enhancement and contrast excretion within normal limits. Aortoiliac calcified atherosclerosis noted. Major arterial structures are patent.  Portal venous system is patent. Moderately decreased density throughout the liver with irregular liver contour. No discrete liver mass. Moderately distended gallbladder with dependent gallstones. No dilatation of the CBD. No dilated intrahepatic biliary tree.  No lymphadenopathy. Small volume of ascites along the distal thoracic esophagus.  IMPRESSION: 1. Hepatic steatosis and cirrhosis with portal venous hypertension including splenomegaly, varices, ascites. No discrete liver mass is identified. 2. Moderately distended gallbladder with cholelithiasis, but no intra- or extrahepatic biliary ductal dilatation. 3. Reactive appearing small and large bowel wall thickening. 4. Pulmonary atelectasis.   Electronically Signed   By: Odessa Fleming M.D.   On: 12/28/2014 11:23   US Abdomen Limited  12/28/2014   CLINICAL DATA:  Jaundice, right upper quadrant discomfort for the past 3 weeks; history of alcohol abuse, nausea, vomiting, and confusion.  EXAM: US ABDOMEN LIMITED - RIGHT UPPER QUADRANT  COMPARISON:  None  FINDINGS: Gallbladder:  The gallbladder is distended and contains echogenic material likely inspissated bile or sludge. The gallbladder wall is thickened to 7 mm. There is no intramural fluid or positive sonographic Murphy's sign. There is adjacent ascites.  Common bile duct:  Diameter: 5 mm  Liver:  There is marked attenuation of the ultrasound beam by the hepatic echotexture. There is no discrete  mass or ductal dilation but evaluation is limited. No focal lesion identified. Within normal limits in parenchymal echogenicity.  IMPRESSION: 1. The gallbladder is mildly distended and the lumen largely filled with echogenic material which may reflect sludge or echogenic bile. There is gallbladder wall thickening. No discrete gallbladder mass is demonstrated but an intraluminal mass could mimic the findings here. 2. Increased hepatic echotexture with poor definition of the parenchymal architecture. 3. CT scanning of the abdomen is recommended for further evaluation of the liver, gallbladder, and pancreas.   Electronically Signed   By: David  [place] M.D.   On: 12/28/2014 09:20    CBC  Recent Labs Lab 12/28/14 0800 12/29/14 0617 12/30/14 0654 12/31/14 0636 01/01/15 0610 01/02/15 0601  WBC 11.7* 8.9 10.7* 13.3* 17.4* 19.2*  HGB 10.7* 9.5*  9.4* 10.0* 11.2* 11.3*  HCT 30.4* 27.0* 26.8* 29.0* 33.3* 35.1*  PLT 209 168 167 182 219 203  MCV 107.8* 108.9* 110.3* 111.5* 115.2* 115.5*  MCH 37.9* 38.3* 38.7* 38.5* 38.8* 37.2*  MCHC 35.2 35.2 35.1 34.5 33.6 32.2  RDW 18.1* 18.2* 18.5* 18.9* 19.4* 19.6*  LYMPHSABS 1.0  --   --   --   --   --   MONOABS 1.0  --   --   --   --   --   EOSABS 0.0  --   --   --   --   --   BASOSABS 0.0  --   --   --   --   --     Chemistries   Recent Labs Lab 12/28/14 1414 12/29/14 0613 12/30/14 0654 12/31/14 0636 01/01/15 0610 01/02/15 0601  NA  --  131* 134* 138 145 144  K  --  2.8* 3.2* 3.8 3.8 3.7  CL  --  94* 101 107 115* 114*  CO2  --  28 24 23 20 22   GLUCOSE  --  94 106* 134* 110* 127*  BUN  --  23 21 24* 27* 26*  CREATININE  --  0.42* 0.44* 0.41* <0.30* <0.30*  CALCIUM  --  8.0* 7.8* 8.2* 8.6 8.5  MG 1.7  --   --   --   --   --   AST  --  156* 141* 131* 175* 205*  ALT  --  28 28 35 52 77*  ALKPHOS  --  136* 113 120* 129* 120*  BILITOT  --  25.5* 26.3* 27.4* 31.0* 32.0*    ------------------------------------------------------------------------------------------------------------------ CrCl cannot be calculated (Patient has no serum creatinine result on file.). ------------------------------------------------------------------------------------------------------------------ No results for input(s): HGBA1C in the last 72 hours. ------------------------------------------------------------------------------------------------------------------ No results for input(s): CHOL, HDL, LDLCALC, TRIG, CHOLHDL, LDLDIRECT in the last 72 hours. ------------------------------------------------------------------------------------------------------------------ No results for input(s): TSH, T4TOTAL, T3FREE, THYROIDAB in the last 72 hours.  Invalid input(s): FREET3 ------------------------------------------------------------------------------------------------------------------ No results for input(s): VITAMINB12, FOLATE, FERRITIN, TIBC, IRON, RETICCTPCT in the last 72 hours.  Coagulation profile  Recent Labs Lab 12/29/14 1041 12/30/14 0654 12/31/14 0858 01/01/15 0610 01/02/15 0601  INR 1.82* 1.94* 1.75* 1.73* 1.74*    No results for input(s): DDIMER in the last 72 hours.  Cardiac Enzymes No results for input(s): CKMB, TROPONINI, MYOGLOBIN in the last 168 hours.  Invalid input(s): CK ------------------------------------------------------------------------------------------------------------------ Invalid input(s): POCBNP    Emori Mumme D.O. on 01/02/2015 at 1:01 PM  Between 7am to 7pm - Pager - [phone]  After 7pm go to [email or web address] - password [email or web address]  And look for the night coverage person covering for me after hours  [facility] Office  [phone] Franklin PetrinMIKHAIL, Masoud Nyce

## 2015-01-02 NOTE — Progress Notes (Signed)
Paracentesis complete no signs of distress. 4000 ml neon yellow colored ascites removed.

## 2015-01-02 NOTE — Progress Notes (Signed)
    Subjective: Wife at bedside. Patient more alert but difficult to understand. Denies abdominal pain. Thinks the year is [id number]. Tolerating liquids. Ate 4 bites of cereal yesterday evening.   Objective: Vital signs in last 24 hours: Temp:  [97.5 F (36.4 C)-97.6 F (36.4 C)] 97.5 F (36.4 C) (04/25 0615) Pulse Rate:  [100-106] 106 (04/25 1123) Resp:  [24] 24 (04/25 0615) BP: (113-127)/(69-81) 113/69 mmHg (04/25 1123) SpO2:  [92 %-93 %] 92 % (04/25 0615) Weight:  [181 lb 14.1 oz (82.5 kg)] 181 lb 14.1 oz (82.5 kg) (04/25 0615) Last BM Date: 01/01/15 General:   Awake, alert to person only.  Eyes:  +scleral icterus Abdomen:  Bowel sounds present, rounded/distended moreso than prior exams, non-tender Extremities:  Without edema. Neurologic:  Alert to person only Skin:  Jaundiced   Intake/Output from previous day: 04/24 0701 - 04/25 0700 In: 1651.3 [P.O.:60; I.V.:1591.3] Out: 525 [Urine:525] Intake/Output this shift:    Lab Results:  Recent Labs  12/31/14 0636 01/01/15 0610 01/02/15 0601  WBC 13.3* 17.4* 19.2*  HGB 10.0* 11.2* 11.3*  HCT 29.0* 33.3* 35.1*  PLT 182 219 203   BMET  Recent Labs  12/31/14 0636 01/01/15 0610 01/02/15 0601  NA 138 145 144  K 3.8 3.8 3.7  CL 107 115* 114*  CO2 23 20 22   GLUCOSE 134* 110* 127*  BUN 24* 27* 26*  CREATININE 0.41* <0.30* <0.30*  CALCIUM 8.2* 8.6 8.5   LFT  Recent Labs  12/31/14 0636 01/01/15 0610 01/02/15 0601  PROT 5.7* 6.1 6.1  ALBUMIN 1.8* 2.0* 1.9*  AST 131* 175* 205*  ALT 35 52 77*  ALKPHOS 120* 129* 120*  BILITOT 27.4* 31.0* 32.0*   PT/INR  Recent Labs  01/01/15 0610 01/02/15 0601  LABPROT 20.4* 20.5*  INR 1.73* 1.74*    Assessment: 59 year old male with acute ETOH hepatitis with overall poor prognosis. Bilirubin continues to rise but renal function appears stable. Leukocytosis likely secondary to IV steroids. He is taking in liquids, so IV Solu-Medrol will be transitioned to oral  prednisolone 40 mg. May need therapeutic paracentesis prior to discharge. Recommend palliative care, and I discussed this with his wife, who is hesitant to pursue this route at the moment. Options limited at this point. Will follow peripherally for now.   Plan: PPI for GI prophylaxis Change to Prednisolone 40 mg daily. Will need to have 30 day course of steroids total (started steroids on 4/22).  Consider therapeutic paracentesis prior to discharge Recommend palliative/hospice Will follow peripherally  Nira RetortAnna W. Sams, ANP-BC [facility]    LOS: 5 days    01/02/2015, 12:08 PM  Attending note:  Patient seen and examined. He remains obtunded.  However, nursing staff reports he did take his prednisolone this morning without any difficulty.

## 2015-01-02 NOTE — Progress Notes (Signed)
Spiritual Care has been consulted.

## 2015-01-02 NOTE — Progress Notes (Addendum)
Left message for Palliative care consult.  Will continue to follow-up.    Spoke with palliative care, who stated that they will set up a meeting with the family.

## 2015-01-03 DIAGNOSIS — E43 Unspecified severe protein-calorie malnutrition: Secondary | ICD-10-CM | POA: Insufficient documentation

## 2015-01-03 LAB — COMPREHENSIVE METABOLIC PANEL
ALT: 90 U/L — ABNORMAL HIGH (ref 0–53)
ANION GAP: 4 — AB (ref 5–15)
AST: 187 U/L — ABNORMAL HIGH (ref 0–37)
Albumin: 1.8 g/dL — ABNORMAL LOW (ref 3.5–5.2)
Alkaline Phosphatase: 114 U/L (ref 39–117)
BUN: 29 mg/dL — ABNORMAL HIGH (ref 6–23)
CO2: 20 mmol/L (ref 19–32)
CREATININE: 0.45 mg/dL — AB (ref 0.50–1.35)
Calcium: 8.1 mg/dL — ABNORMAL LOW (ref 8.4–10.5)
Chloride: 122 mmol/L — ABNORMAL HIGH (ref 96–112)
GFR calc Af Amer: >90 mL/min (ref >90)
GFR calc non Af Amer: >90 mL/min (ref >90)
Glucose, Bld: 124 mg/dL — ABNORMAL HIGH (ref 70–99)
POTASSIUM: 3 mmol/L — AB (ref 3.5–5.1)
Sodium: 146 mmol/L — ABNORMAL HIGH (ref 135–145)
Total Bilirubin: 29.9 mg/dL (ref 0.3–1.2)
Total Protein: 5.4 g/dL — ABNORMAL LOW (ref 6.0–8.3)

## 2015-01-03 LAB — CBC
HEMATOCRIT: 35.8 % — AB (ref 39.0–52.0)
Hemoglobin: 11.6 g/dL — ABNORMAL LOW (ref 13.0–17.0)
MCH: 37.7 pg — AB (ref 26.0–34.0)
MCHC: 32.4 g/dL (ref 30.0–36.0)
MCV: 116.2 fL — ABNORMAL HIGH (ref 78.0–100.0)
Platelets: 157 10*3/uL (ref 150–400)
RBC: 3.08 MIL/uL — ABNORMAL LOW (ref 4.22–5.81)
RDW: 19.2 % — ABNORMAL HIGH (ref 11.5–15.5)
WBC: 19.3 10*3/uL — ABNORMAL HIGH (ref 4.0–10.5)

## 2015-01-03 LAB — PROTIME-INR
INR: 1.9 — ABNORMAL HIGH (ref 0.00–1.49)
Prothrombin Time: 21.9 seconds — ABNORMAL HIGH (ref 11.6–15.2)

## 2015-01-03 MED ORDER — POTASSIUM CHLORIDE 20 MEQ PO PACK
40.0000 meq | PACK | Freq: Two times a day (BID) | ORAL | Status: AC
  Administered 2015-01-03 (×2): 40 meq via ORAL
  Filled 2015-01-03 (×2): qty 2

## 2015-01-03 MED ORDER — LORAZEPAM 2 MG/ML IJ SOLN: 0.2500 mg | INTRAMUSCULAR | Status: DC | PRN

## 2015-01-03 MED ORDER — LACTULOSE 10 GM/15ML PO SOLN
20.0000 g | Freq: Two times a day (BID) | ORAL | Status: DC
  Administered 2015-01-03 – 2015-01-04 (×3): 20 g via ORAL
  Filled 2015-01-03 (×3): qty 30

## 2015-01-03 NOTE — Progress Notes (Signed)
Follow-up goals of care meeting today with Mr. Franklin Sloan, his wife Franklin Sloan and sister Franklin Sloan.  The family had some meaningful discussion last evening and also engaged Franklin CountsBob in discussion of what his goals are in light of the terminal prognosis he is facing.  The decision for inpatient hospice referral to enhance quality of life, provide relief of suffering and symptom management has been made.    He verbalized that he does not want any further invasive procedures or treatment but wishes to be comfortable and restful spending time with his children and family.    Clinically he has not maintained any significant improvement.  He did obtain relief from SOB after the paracentesis procedure yesterday.  His PO intake is very poor, limited to sips of water and a popsicle.  He appears very weak and his voice is just above a whisper.  Upon questioning him about a wish he would have he responded "not to be here anymore"  I explored the meaning of this with him and he repeated he just 'does not want to be here'. I had a private discussion with Franklin Sloan and Franklin Sloan in the hallway and discussed that perhaps Franklin CountsBob was verbalizing his acceptance of mortality.  I encouraged insightful listening and the ability to "walk this road" with Franklin CountsBob, this would be a gift for all involved and perhaps provide a measure of spiritual healing.  Based on his physical assessment and severity of illness his prognosis is likely days to less than 2 weeks.  [facility] as family choice.  Cindra PresumeSue Ellen Quentavious Rittenhouse, RN, MSN, [facility]

## 2015-01-03 NOTE — Progress Notes (Addendum)
[facility]                                                                              Patient Demographics  Franklin Sloan, is a 59 y.o. male, DOB - October 18, 1955, ZOX:[id number]  Admit date - 12/28/2014   Admitting Physician Edsel Petrin, DO  Outpatient Primary MD for the patient is No primary care provider on file.  LOS - 6   Chief Complaint  Patient presents with  . Jaundice      HPI on 12/29/2014 by Ms. [name], NP Franklin Sloan is a 59 y.o. male with a past medical history that includes EtOH abuse presents to the emergency department from home with the chief complaint of jaundice. Initial evaluation reveals, cirrhosis, cholelithiasis, hepatic steatosis, hyperbilirubinemia anemia, portal hypertension. Information is obtained from the patient. He states he lives with his sister she brought him to the emergency room today for a 2 day history of nausea and dry heaving and jaundice. Associated symptoms include abdominal pain described as intermittent sharp located in the upper right quadrant, decreased by mouth intake and decreased energy. Patient reports he drinks 1/2 of a fifth to a whole fifth of vodka daily for the last 2 years. He reports 4 years prior that he stopped drinking altogether. He reports not noticing his own yellow color. He denies fever chills chest pain palpitations shortness of breath cough headache visual disturbances syncope or near-syncope. He denies dysuria hematuria frequency or urgency he denies diarrhea constipation melena. Workup in the emergency department significant for WBCs 11.7 hemoglobin 10.7 sodium 127 potassium 2.7 chloride 87 BUN 25 INR 1.5 for serum glucose 113. Abdominal ultrasound reveals gallbladder distended with possible sludge and wall thickening, CT of the abdomen pelvis reveals hepatic steatosis and cirrhosis with portal venous hypertension including splenomegaly varices and ascites, not really distended gallbladder with cholelithiasis but no  biliary ductal dilation, small and large bowel wall thickening, pulmonary atelectasis. In the emergency department he is afebrile hemodynamically stable and not hypoxic  Interim history Patient found to have alcoholic hepatitis. Bilirubin currently 29.9. He was started on steroids. Gastroenterology was consulted. Patient underwent paracentesis on 01/02/2015. Labs currently pending. Palliative care also consulted.  I have spoken with the patient, his wife, and sister regarding possible hospice. Patient has been made DO NOT RESUSCITATE.  Assessment & Plan   Jaundice/alcoholic cirrhosis/hepatitis/portal hypertension/Hepatic Encephalopathy -Patient more awake today -Found on CT of the abdomen -Gastroenterology consulted and aappreciated -Continue lactulose enema, PPI, solumedrol changed to prednisolone 40mg  daily  -MELD Score upon admission >22, discriminate function > 40 -hepatitis panel negative -Patient is at high mortality risk, overall prognosis poor -Spoke with gastroenterology, unlikely to recover -Not a candidate for transplant -Spoke with wife, sister, patient regarding hospice and palliative care.  Meeting set for 2pm today.   Hyperbilirubinemia -Secondary to above, continues to rise- today 32 -Continue to monitor CMP  Hyperammonemia -Ammonia level improving with lactulose enema- patient currently tolerating PO, will change route -Ammonia 33  Alcohol abuse -Alcohol level 129 upon admission -Continue CIWA protocol   Ascites  -Secondary to above -Small amount per CT  -Increasing in size since original CT -Patient had US guided paracentesis on  01/02/2015, with 4L of fluid removed -Cultures show no organisms  Hyponatremia -resolved, with IVF, will continue to monitor BMP -TSH 1.136  Hypokalemia -Resolved, continue to monitor BMP and replace as needed -Magnesium 1.7, replaced  Macrocytic Anemia -iron 92, Ferritin 2258 (likely APR) -Continue to monitor CBC -No signs of  bleeding -Hb stable 11.6  Coagulopathy -Secondary to the above  -Patient was given Vit K 10mg  (2 doses) for INR of 1.9  Leukocytosis -Likely secondary to solumedrol, started on 12/30/2014 -Currently no source of infection, afebrile -Continue to monitor CBC, increased 19.3  Severe protein calorie malnutrition  -Nutrition consulted -Continue feeding supplements  Code Status: DNR  Family Communication: Wife and sister at bedside.  Disposition Plan: Admitted. Pending palliative care consult at 2pm today  Time Spent in minutes   30 minutes  Procedures  US guided paracentesis  Consults   Gastroenterology Pulmonology PCCM via phone IR Palliative care  DVT Prophylaxis  SCDs  Lab Results  Component Value Date   PLT 157 01/03/2015    Medications  Scheduled Meds: . antiseptic oral rinse  7 mL Mouth Rinse BID  . feeding supplement (ENSURE ENLIVE)  237 mL Oral BID BM  . folic acid  1 mg Oral Daily  . lactulose  20 g Oral BID  . multivitamin with minerals  1 tablet Oral Daily  . pantoprazole (PROTONIX) IV  40 mg Intravenous Q24H  . prednisoLONE  40 mg Oral QAC breakfast  . sodium chloride  3 mL Intravenous Q12H  . thiamine  100 mg Oral Daily   Or  . thiamine  100 mg Intravenous Daily   Continuous Infusions: . dextrose 5 % and 0.45% NaCl 75 mL/hr at 01/03/15 0313   PRN Meds:.alum & mag hydroxide-simeth, LORazepam, morphine injection, ondansetron **OR** ondansetron (ZOFRAN) IV, traZODone  Antibiotics    Anti-infectives    None        Subjective:   Franklin Sloan seen and examined today.  Patient currently resting.  No complaints of abdominal pain, chest pain, SOB.    Objective:   Filed Vitals:   01/02/15 1123 01/02/15 1544 01/02/15 2035 01/03/15 0606  BP: 113/69 112/97 131/81 100/71  Pulse: 106 100 103 109  Temp:   97.5 F (36.4 C) 98.2 F (36.8 C)  TempSrc:   Oral Oral  Resp:  20 20 20   Height:      Weight:    77.837 kg (171 lb 9.6 oz)  SpO2:  92%  96% 94%    Wt Readings from Last 3 Encounters:  01/03/15 77.837 kg (171 lb 9.6 oz)     Intake/Output Summary (Last 24 hours) at 01/03/15 1157 Last data filed at 01/03/15 0701  Gross per 24 hour  Intake 1846.25 ml  Output   1150 ml  Net 696.25 ml    Exam  General: Well developed, thin, jaundice  HEENT: NCAT, Sceral icterus,  mucous membranes moist.   Cardiovascular: RRR, normal S1/S2, RRR  Respiratory: Clear to auscultation  Abdomen: Soft, nontender, mildly distended,+HSM  Extremities: warm dry without cyanosis clubbing or edema  Data Review   Micro Results Recent Results (from the past 240 hour(s))  Body fluid culture     Status: None (Preliminary result)   Collection Time: 01/02/15  3:50 PM  Result Value Ref Range Status   Specimen Description ASCITIC FLUID  Final   Special Requests NONE  Final   Gram Stain   Final    RARE WBC PRESENT, PREDOMINANTLY MONONUCLEAR NO ORGANISMS SEEN  Performed at [facility] PENDING  Incomplete   Report Status PENDING  Incomplete  Anaerobic culture     Status: None (Preliminary result)   Collection Time: 01/02/15  3:50 PM  Result Value Ref Range Status   Specimen Description ASCITIC FLUID  Final   Special Requests NONE  Final   Gram Stain   Final    RARE WBC PRESENT, PREDOMINANTLY MONONUCLEAR NO ORGANISMS SEEN Performed at [facility]    Culture   Final    NO ANAEROBES ISOLATED; CULTURE IN PROGRESS FOR 5 DAYS Performed at [facility]    Report Status PENDING  Incomplete    Radiology Reports Dg Chest 2 View  12/28/2014   CLINICAL DATA:  59 year old male with vomiting and cough for 1 week. Jaundice. Initial encounter.  EXAM: CHEST  2 VIEW  COMPARISON:  None.  FINDINGS: Low lung volumes. Mild crowding of markings at both lung bases. Normal cardiac size and mediastinal contours. Visualized tracheal air column is within normal limits. No pneumothorax, pulmonary edema or pleural effusion. No  acute osseous abnormality identified.  IMPRESSION: Low lung volumes, otherwise no acute cardiopulmonary abnormality.   Electronically Signed   By: Odessa Fleming M.D.   On: 12/28/2014 09:35   Ct Abdomen Pelvis W Contrast  12/28/2014   CLINICAL DATA:  59 year old male with new onset jaundice since last week. Nausea vomiting and cold like illness symptoms for 3 weeks. Confusion today. Initial encounter.  EXAM: CT ABDOMEN AND PELVIS WITH CONTRAST  TECHNIQUE: Multidetector CT imaging of the abdomen and pelvis was performed using the standard protocol following bolus administration of intravenous contrast.  CONTRAST:  OMNIPAQUE IOHEXOL 300 MG/ML  SOLN  COMPARISON:  Abdomen ultrasound 0857 hours today. Chest radiographs 0918 hours today.  FINDINGS: Atelectasis at both lung bases. Mostly calcified granuloma in the right costophrenic angle. No pericardial or pleural effusion.  No acute osseous abnormality identified.  Small to moderate volume of pelvic ascites. Small fat containing inguinal hernia with trace fluid.  Space of Retzius varices. Otherwise negative bladder. Negative distal colon.  Sigmoid and left colon diverticulosis. Mild wall thickening of the 8 transverse colon and right colon probably is reactive. Normal appendix. No dilated small bowel, but there are thickened loops of small bowel in the left abdomen (series 2, image 73). Negative stomach. Mild duodenum all thickening and small second portion duodenum diverticulum on series 2, image 47.  Splenomegaly, splenic volume calculated at 1,206 mL (normal splenic volume range 83 - 412 mL). Small to medium caliber varices at the root of the mesentery and tracking within the small bowel mesentery. Recannulized paraumbilical vein and small caliber are ventral abdominal wall varices. Small volume abdominal ascites.  Negative pancreas and adrenal glands. Renal enhancement and contrast excretion within normal limits. Aortoiliac calcified atherosclerosis noted. Major  arterial structures are patent.  Portal venous system is patent. Moderately decreased density throughout the liver with irregular liver contour. No discrete liver mass. Moderately distended gallbladder with dependent gallstones. No dilatation of the CBD. No dilated intrahepatic biliary tree.  No lymphadenopathy. Small volume of ascites along the distal thoracic esophagus.  IMPRESSION: 1. Hepatic steatosis and cirrhosis with portal venous hypertension including splenomegaly, varices, ascites. No discrete liver mass is identified. 2. Moderately distended gallbladder with cholelithiasis, but no intra- or extrahepatic biliary ductal dilatation. 3. Reactive appearing small and large bowel wall thickening. 4. Pulmonary atelectasis.   Electronically Signed   By: Althea Grimmer.D.  On: 12/28/2014 11:23   US Abdomen Limited  12/28/2014   CLINICAL DATA:  Jaundice, right upper quadrant discomfort for the past 3 weeks; history of alcohol abuse, nausea, vomiting, and confusion.  EXAM: US ABDOMEN LIMITED - RIGHT UPPER QUADRANT  COMPARISON:  None  FINDINGS: Gallbladder:  The gallbladder is distended and contains echogenic material likely inspissated bile or sludge. The gallbladder wall is thickened to 7 mm. There is no intramural fluid or positive sonographic Murphy's sign. There is adjacent ascites.  Common bile duct:  Diameter: 5 mm  Liver:  There is marked attenuation of the ultrasound beam by the hepatic echotexture. There is no discrete mass or ductal dilation but evaluation is limited. No focal lesion identified. Within normal limits in parenchymal echogenicity.  IMPRESSION: 1. The gallbladder is mildly distended and the lumen largely filled with echogenic material which may reflect sludge or echogenic bile. There is gallbladder wall thickening. No discrete gallbladder mass is demonstrated but an intraluminal mass could mimic the findings here. 2. Increased hepatic echotexture with poor definition of the parenchymal  architecture. 3. CT scanning of the abdomen is recommended for further evaluation of the liver, gallbladder, and pancreas.   Electronically Signed   By: David  [place] M.D.   On: 12/28/2014 09:20   US Paracentesis  01/02/2015   CLINICAL DATA:  Ascites  EXAM: ULTRASOUND GUIDED  PARACENTESIS  COMPARISON:  None.  PROCEDURE: An ultrasound guided paracentesis was thoroughly discussed with the patient and questions answered. The benefits, risks, alternatives and complications were also discussed. The patient understands and wishes to proceed with the procedure. Written consent was obtained.  Ultrasound was performed to localize and mark an adequate pocket of fluid in the right lower quadrant of the abdomen. The area was then prepped and draped in the normal sterile fashion. 1% Lidocaine was used for local anesthesia. Under ultrasound guidance a 19 gauge Yueh catheter was introduced. Paracentesis was performed. The catheter was removed and a dressing applied.  Complications: None.  FINDINGS: A total of approximately 4 L of bright yellow fluid was removed. A fluid sample was not sent for laboratory analysis.  IMPRESSION: Successful ultrasound guided paracentesis yielding 4 L of ascites.   Electronically Signed   By: Charlett Nose M.D.   On: 01/02/2015 16:10    CBC  Recent Labs Lab 12/28/14 0800  12/30/14 0654 12/31/14 0636 01/01/15 0610 01/02/15 0601 01/03/15 0603  WBC 11.7*  < > 10.7* 13.3* 17.4* 19.2* 19.3*  HGB 10.7*  < > 9.4* 10.0* 11.2* 11.3* 11.6*  HCT 30.4*  < > 26.8* 29.0* 33.3* 35.1* 35.8*  PLT 209  < > 167 182 219 203 157  MCV 107.8*  < > 110.3* 111.5* 115.2* 115.5* 116.2*  MCH 37.9*  < > 38.7* 38.5* 38.8* 37.2* 37.7*  MCHC 35.2  < > 35.1 34.5 33.6 32.2 32.4  RDW 18.1*  < > 18.5* 18.9* 19.4* 19.6* 19.2*  LYMPHSABS 1.0  --   --   --   --   --   --   MONOABS 1.0  --   --   --   --   --   --   EOSABS 0.0  --   --   --   --   --   --   BASOSABS 0.0  --   --   --   --   --   --   < > = values  in this interval not displayed.  Chemistries  Recent Labs Lab 12/28/14 1414  12/30/14 0654 12/31/14 0636 01/01/15 0610 01/02/15 0601 01/03/15 0603  NA  --   < > 134* 138 145 144 146*  K  --   < > 3.2* 3.8 3.8 3.7 3.0*  CL  --   < > 101 107 115* 114* 122*  CO2  --   < > 24 23 20 22 20   GLUCOSE  --   < > 106* 134* 110* 127* 124*  BUN  --   < > 21 24* 27* 26* 29*  CREATININE  --   < > 0.44* 0.41* <0.30* <0.30* 0.45*  CALCIUM  --   < > 7.8* 8.2* 8.6 8.5 8.1*  MG 1.7  --   --   --   --   --   --   AST  --   < > 141* 131* 175* 205* 187*  ALT  --   < > 28 35 52 77* 90*  ALKPHOS  --   < > 113 120* 129* 120* 114  BILITOT  --   < > 26.3* 27.4* 31.0* 32.0* 29.9*  < > = values in this interval not displayed. ------------------------------------------------------------------------------------------------------------------ estimated creatinine clearance is 107.2 mL/min (by C-G formula based on Cr of 0.45). ------------------------------------------------------------------------------------------------------------------ No results for input(s): HGBA1C in the last 72 hours. ------------------------------------------------------------------------------------------------------------------ No results for input(s): CHOL, HDL, LDLCALC, TRIG, CHOLHDL, LDLDIRECT in the last 72 hours. ------------------------------------------------------------------------------------------------------------------ No results for input(s): TSH, T4TOTAL, T3FREE, THYROIDAB in the last 72 hours.  Invalid input(s): FREET3 ------------------------------------------------------------------------------------------------------------------ No results for input(s): VITAMINB12, FOLATE, FERRITIN, TIBC, IRON, RETICCTPCT in the last 72 hours.  Coagulation profile  Recent Labs Lab 12/30/14 0654 12/31/14 0858 01/01/15 0610 01/02/15 0601 01/03/15 0603  INR 1.94* 1.75* 1.73* 1.74* 1.90*    No results for input(s): DDIMER in  the last 72 hours.  Cardiac Enzymes No results for input(s): CKMB, TROPONINI, MYOGLOBIN in the last 168 hours.  Invalid input(s): CK ------------------------------------------------------------------------------------------------------------------ Invalid input(s): POCBNP    Breuna Loveall D.O. on 01/03/2015 at 11:57 AM  Between 7am to 7pm - Pager - [phone]  After 7pm go to [email or web address] - password [email or web address]  And look for the night coverage person covering for me after hours  [facility] Office  [phone] Edsel Petrin

## 2015-01-03 NOTE — Clinical Social Work Note (Signed)
Clinical Social Work Assessment  Patient Details  Name: Franklin Sloan MRN: [id number] Date of Birth: March 13, 1956  Date of referral:  01/03/15               Reason for consult:  End of Life/Hospice                Permission sought to share information with:  Family Supports Permission granted to share information::     Name::     Beth and Surveyor, minerals::     Relationship::     Contact Information:     Housing/Transportation Living arrangements for the past 2 months:  Single Family Home (living with sister, Eustaquio Maize) Source of Information:  Spouse, Other (Comment Required) (sister) Patient Interpreter Needed:  None Criminal Activity/Legal Involvement Pertinent to Current Situation/Hospitalization:  No - Comment as needed Significant Relationships:  Adult Children, Siblings, Spouse Lives with:  Siblings Do you feel safe going back to the place where you live?  Yes Need for family participation in patient care:  Yes (Comment) (pt will require assistance and support from family during end of life)  Care giving concerns: Family unable to provide around the clock care at home.    Social Worker assessment / plan:  CSW met with pt, pt's wife Marcie Bal, and sister Beth at bedside. Pt was restless, but family indicated that he was tired and needed to be cleaned up. RN notified. Pt reports that he would rather CSW just speak with his family. Pt has been living with his sister for the past two years. He is married, but Marcie Bal reports they have lived more independent lives recently. However, they remain in contact and involved. They have two children, ages 51 and 87. Cristie Hem is on leave from the [employer] due to pt's medical issues. Their daughter lives in [place] and is in school. Family report pt has been an alcoholic for many years, but quit several years ago. Beth noticed signs that he started drinking again in the past few months. He was still working full time. Pt came to ED due to jaundice and admitted with  alcoholic cirrhosis. He has been in hospital for almost a week now. Yesterday, palliative care was consulted. Family were not agreeing at that point. They have further discussed recommendations for hospice with pt and he has been clear that these are his wishes. Family is also now agreeable. Pt is appropriate for residential hospice and CSW discussed these options. They request [facility] in [place] as [facility] lives in [place] and is most available to visit often. CSW sent referral for review and spoke with facility.   Employment status:  [employer] information:  Self Pay (Medicaid Pending) PT Recommendations:  Not assessed at this time Information / Referral to community resources:  Other (Comment Required) (Hospice Home)  Patient/Family's Response to care:  Patient requested that CSW speak with family to allow him to rest. Pt and family have met with palliative care and are agreeable to Hospice Home. They request [facility] in [place].    Patient/Family's Understanding of and Emotional Response to Diagnosis, Current Treatment, and Prognosis:  Per conversation with palliative care, pt appears to be at peace with his decision. Family feel that he is accepting of prognosis and his sister feels that he knew about 3 weeks ago that he was dying, but did not want to come to hospital. CSW addressed where family was emotionally. His sister indicates that she has had cancer and also been  through this with other family and feels that pt needs "permission" to let go and also needs to hear from family "we will be okay." His wife reports that she is a Conservation officer, nature." She admits that she is not completely "there" yet but wants to be supportive of pt's decision. It is hard for her to stop encouraging pt that he can continue "fighting." CSW encouraged them to speak to pt and ask what he needs at this time from them. Family feel that pt tries to protect them. However, he has had more frank conversations  with Eustaquio Maize than his wife and children. Support provided to family.    Emotional Assessment Appearance:  Appears older than stated age Attitude/Demeanor/Rapport:  Other (Pt restless (family report he is very tired)) Affect (typically observed):  Accepting Orientation:  Oriented to Self, Oriented to Situation Alcohol / Substance use:  Alcohol Use Psych involvement (Current and /or in the community):     Discharge Needs  Concerns to be addressed:  Discharge Planning Concerns, Decision making concerns, Adjustment to Illness Readmission within the last 30 days:  No Current discharge risk:  Terminally ill Barriers to Discharge:  No Barriers Identified   Salome Arnt, [place] 01/03/2015, 3:27 PM  [phone]

## 2015-01-04 LAB — COMPREHENSIVE METABOLIC PANEL
ALT: 118 U/L — ABNORMAL HIGH (ref 0–53)
ANION GAP: 7 (ref 5–15)
AST: 171 U/L — ABNORMAL HIGH (ref 0–37)
Albumin: 1.8 g/dL — ABNORMAL LOW (ref 3.5–5.2)
Alkaline Phosphatase: 130 U/L — ABNORMAL HIGH (ref 39–117)
BUN: 39 mg/dL — ABNORMAL HIGH (ref 6–23)
CO2: 19 mmol/L (ref 19–32)
Calcium: 8.2 mg/dL — ABNORMAL LOW (ref 8.4–10.5)
Chloride: 119 mmol/L — ABNORMAL HIGH (ref 96–112)
Creatinine, Ser: 0.9 mg/dL (ref 0.50–1.35)
GFR calc non Af Amer: >90 mL/min (ref >90)
GLUCOSE: 154 mg/dL — AB (ref 70–99)
POTASSIUM: 3.7 mmol/L (ref 3.5–5.1)
SODIUM: 145 mmol/L (ref 135–145)
Total Bilirubin: 34.9 mg/dL (ref 0.3–1.2)
Total Protein: 5.5 g/dL — ABNORMAL LOW (ref 6.0–8.3)

## 2015-01-04 LAB — CBC
HCT: 38.1 % — ABNORMAL LOW (ref 39.0–52.0)
Hemoglobin: 12.5 g/dL — ABNORMAL LOW (ref 13.0–17.0)
MCH: 38.1 pg — AB (ref 26.0–34.0)
MCHC: 32.8 g/dL (ref 30.0–36.0)
MCV: 116.2 fL — AB (ref 78.0–100.0)
Platelets: 162 10*3/uL (ref 150–400)
RBC: 3.28 MIL/uL — AB (ref 4.22–5.81)
RDW: 18.7 % — ABNORMAL HIGH (ref 11.5–15.5)
WBC: 24.5 10*3/uL — ABNORMAL HIGH (ref 4.0–10.5)

## 2015-01-04 LAB — PROTIME-INR
INR: 1.82 — ABNORMAL HIGH (ref 0.00–1.49)
PROTHROMBIN TIME: 21.3 s — AB (ref 11.6–15.2)

## 2015-01-04 LAB — AMMONIA: Ammonia: 38 umol/L — ABNORMAL HIGH (ref 11–32)

## 2015-01-04 MED ORDER — LORAZEPAM 1 MG PO TABS: 1.0000 mg | ORAL_TABLET | ORAL | Status: AC | PRN

## 2015-01-04 MED ORDER — ONDANSETRON HCL 4 MG PO TABS: 4.0000 mg | ORAL_TABLET | Freq: Four times a day (QID) | ORAL | Status: AC | PRN

## 2015-01-04 MED ORDER — MORPHINE SULFATE (CONCENTRATE) 10 MG/0.5ML PO SOLN: 4.0000 mg | ORAL | Status: DC | PRN

## 2015-01-04 MED ORDER — MORPHINE SULFATE (CONCENTRATE) 10 MG/0.5ML PO SOLN: 4.0000 mg | ORAL | Status: AC | PRN

## 2015-01-04 NOTE — Clinical Social Work Note (Signed)
[facility] did not feel that pt met their criteria. Pt and family notified and they requested either [facility] or [facility]. [facility] also declined pt due to not meeting criteria, but [facility] has bed available for pt. Pt was sleeping when CSW spoke with wife. She accepts and will plan to follow [facility] in order to complete paperwork at facility. RN to call report. Out of facility DNR sent with pt. CSW also spoke with pt's sister, Eustaquio Maize on phone. Support provided to family.  Benay Pike, [place]

## 2015-01-04 NOTE — Progress Notes (Signed)
Pt going to be transferred to Hospice house in [place] per SW. Report called to RN and she requested we leave IV and foley in place. Pt will be leaving via EMS shortly

## 2015-01-04 NOTE — Discharge Summary (Signed)
Physician Discharge Summary  Franklin QuintRobert Gamarra NWG:[id number] DOB: 1955/10/26 DOA: 12/28/2014  PCP: No primary care provider on file.  Admit date: 12/28/2014 Discharge date: 01/04/2015  Patient is being discharged to residential hospice [facility] No further follow-up in the outpatient setting as indicated.   Discharge Diagnoses:  Jaundice/alcoholic cirrhosis/hepatitis/portal hypertension/Hepatic Encephalopathy -Patient more awake today -Found on CT of the abdomen--hepatic cirrhosis, steatosis, with splenomegaly and signs of portal hypertension -Gastroenterology consulted and appreciated -Continue lactulose enema, PPI, solumedrol changed to prednisolone 40mg  daily--> continue while the patient is in hospital, but will discontinue when the patient is transferred to residential hospice -MELD Score upon admission >22, discriminate function > 40 -hepatitis panel negative -Patient is at high mortality risk, overall prognosis poor -Spoke with gastroenterology, unlikely to recover -Not a candidate for transplant -Spoke with wife, sister, patient regarding hospice and palliative care.   Goals of care -The patient was seen by palliative care -Family and patient are agreeable to transfer to residential hospice -As such, we'll continue medications while the patient is in the hospital, but will plan to discontinue all nonessential medications when the patient is discharged with focus on the patient's comfort and symptom management -Prognosis extremely poor -Expect a death in days to less than 2 weeks -As the patient's focus of care has transitioned to a focus of comfort, discontinue all laboratory as well as radiographic studies -Ativan when necessary anxiety -Morphine when necessary pain and anxiety  Hyperbilirubinemia -Secondary to above, continues to rise- today 34.9 -Continue to monitor CMP -No longer an active issue as the patient's focus of care has transitioned to  full comfort  Hyperammonemia -Ammonia level improving with lactulose enema- patient currently tolerating PO, will change route -Ammonia 38 on 01/04/2015  Alcohol abuse -Alcohol level 129 upon admission -Continue CIWA protocol   Ascites  -Secondary to above -Small amount per CT  -Increasing in size since original CT -Patient had US guided paracentesis on 01/02/2015, with 4L of fluid removed -Cultures show no organisms -As the patient's focus of care has changed to full comfort, treat symptomatically with morphine  Hyponatremia -resolved, with IVF, will continue to monitor BMP -TSH 1.136  Hypokalemia -Resolved, continue to monitor BMP and replace as needed -Magnesium 1.7, replaced  Macrocytic Anemia -iron 92, Ferritin 2258 (likely APR) -Continue to monitor CBC -No signs of bleeding -Hb stable 11.6  Coagulopathy -Secondary to the above  -Patient was given Vit K 10mg  (2 doses) for INR of 1.9  Leukocytosis -Likely secondary to solumedrol, started on 12/30/2014 -Currently no source of infection, afebrile -Continue to monitor CBC, increased 19.3  Severe protein calorie malnutrition  -Nutrition consulted -Continue feeding supplements  Code Status: DNR/Comfort Care  Family Communication:  sister updated bedside.  Disposition Plan: Residential hospice  Discharge Condition: stable  Disposition: Residential hospice  Diet:regular Wt Readings from Last 3 Encounters:  01/04/15 78 kg (171 lb 15.3 oz)    History of present illness:  Franklin Sloan is a 59 y.o. male with a past medical history that includes EtOH abuse presents to the emergency department from home with the chief complaint of jaundice. Initial evaluation reveals, cirrhosis, cholelithiasis, hepatic steatosis, hyperbilirubinemia anemia, portal hypertension. Information is obtained from the patient. He states he lives with his sister she brought him to the emergency room today for a 2 day history of nausea and  dry heaving and jaundice. Associated symptoms include abdominal pain described as intermittent sharp located in the upper right quadrant, decreased by mouth intake and  decreased energy. Patient reports he drinks 1/2 of a fifth to a whole fifth of vodka daily for the last 2 years. He reports 4 years prior that he stopped drinking altogether. He reports not noticing his own yellow color. He denies fever chills chest pain palpitations shortness of breath cough headache visual disturbances syncope or near-syncope. He denies dysuria hematuria frequency or urgency he denies diarrhea constipation melena. Workup in the emergency department significant for WBCs 11.7 hemoglobin 10.7 sodium 127 potassium 2.7 chloride 87 BUN 25 INR 1.5 for serum glucose 113. Abdominal ultrasound reveals gallbladder distended with possible sludge and wall thickening, CT of the abdomen pelvis reveals hepatic steatosis and cirrhosis with portal venous hypertension including splenomegaly varices and ascites, not really distended gallbladder with cholelithiasis but no biliary ductal dilation, small and large bowel wall thickening, pulmonary atelectasis. In the emergency department he is afebrile hemodynamically stable and not hypoxic  Interim history Patient found to have alcoholic hepatitis. Bilirubin currently 29.9. He was started on steroids. Gastroenterology was consulted and the patient was started on prednisone. Patient underwent paracentesis on 01/02/2015. Labs currently pending. Palliative care also consulted. Patient has been made DO NOT RESUSCITATE. The patient was seen by palliative care. After the meeting, the patient and family have agreed to transition to residential hospice. As such, his maintenance medications will be continued for now, But his nonessential medications will be discontinued after transfer to residential hospice.   Consultants: GI Pulmonology Palliative medicine  Discharge Exam: Filed Vitals:   01/04/15  0516  BP: 109/66  Pulse: 109  Temp: 97.5 F (36.4 C)  Resp: 22   Filed Vitals:   01/03/15 0606 01/03/15 1300 01/03/15 2220 01/04/15 0516  BP: 100/71 106/64 109/67 109/66  Pulse: 109 105 106 109  Temp: 98.2 F (36.8 C) 98.2 F (36.8 C) 97.4 F (36.3 C) 97.5 F (36.4 C)  TempSrc: Oral Oral Oral Oral  Resp: Height:      Weight: 77.837 kg (171 lb 9.6 oz)   78 kg (171 lb 15.3 oz)  SpO2: 94% 96% 96% 98%   General: Awake and alert, NAD, pleasant, cooperative Cardiovascular: RRR, no rub, no gallop, no S3 Respiratory: diminished breath sounds but with auscultation Abdomen:soft, nontender, nondistended, positive bowel sounds Extremities: 1+LE edema, No lymphangitis, no petechiae  Discharge Instructions      Discharge Instructions    Diet - low sodium heart healthy    Complete by:  As directed      Increase activity slowly    Complete by:  As directed             Medication List    TAKE these medications        dextromethorphan-guaiFENesin 10-100 MG/5ML liquid  Commonly known as:  ROBITUSSIN-DM  Take 30 mLs by mouth every 4 (four) hours as needed for cough.     LORazepam 1 MG tablet  Commonly known as:  ATIVAN  Take 1 tablet (1 mg total) by mouth every 4 (four) hours as needed for anxiety.     morphine CONCENTRATE 10 MG/0.5ML Soln concentrated solution  Take 0.2 mLs (4 mg total) by mouth every 2 (two) hours as needed for moderate pain, anxiety or shortness of breath.     NYQUIL MULTI-SYMPTOM PO  Take 30 mLs by mouth daily.     ondansetron 4 MG tablet  Commonly known as:  ZOFRAN  Take 1 tablet (4 mg total) by mouth every 6 (six) hours as needed for  nausea.         The results of significant diagnostics from this hospitalization (including imaging, microbiology, ancillary and laboratory) are listed below for reference.    Significant Diagnostic Studies: Dg Chest 2 View  12/28/2014   CLINICAL DATA:  59 year old male with vomiting and cough for 1  week. Jaundice. Initial encounter.  EXAM: CHEST  2 VIEW  COMPARISON:  None.  FINDINGS: Low lung volumes. Mild crowding of markings at both lung bases. Normal cardiac size and mediastinal contours. Visualized tracheal air column is within normal limits. No pneumothorax, pulmonary edema or pleural effusion. No acute osseous abnormality identified.  IMPRESSION: Low lung volumes, otherwise no acute cardiopulmonary abnormality.   Electronically Signed   By: Odessa Fleming M.D.   On: 12/28/2014 09:35   Ct Abdomen Pelvis W Contrast  12/28/2014   CLINICAL DATA:  59 year old male with new onset jaundice since last week. Nausea vomiting and cold like illness symptoms for 3 weeks. Confusion today. Initial encounter.  EXAM: CT ABDOMEN AND PELVIS WITH CONTRAST  TECHNIQUE: Multidetector CT imaging of the abdomen and pelvis was performed using the standard protocol following bolus administration of intravenous contrast.  CONTRAST:  OMNIPAQUE IOHEXOL 300 MG/ML  SOLN  COMPARISON:  Abdomen ultrasound 0857 hours today. Chest radiographs 0918 hours today.  FINDINGS: Atelectasis at both lung bases. Mostly calcified granuloma in the right costophrenic angle. No pericardial or pleural effusion.  No acute osseous abnormality identified.  Small to moderate volume of pelvic ascites. Small fat containing inguinal hernia with trace fluid.  Space of Retzius varices. Otherwise negative bladder. Negative distal colon.  Sigmoid and left colon diverticulosis. Mild wall thickening of the 8 transverse colon and right colon probably is reactive. Normal appendix. No dilated small bowel, but there are thickened loops of small bowel in the left abdomen (series 2, image 73). Negative stomach. Mild duodenum all thickening and small second portion duodenum diverticulum on series 2, image 47.  Splenomegaly, splenic volume calculated at 1,206 mL (normal splenic volume range 83 - 412 mL). Small to medium caliber varices at the root of the mesentery and  tracking within the small bowel mesentery. Recannulized paraumbilical vein and small caliber are ventral abdominal wall varices. Small volume abdominal ascites.  Negative pancreas and adrenal glands. Renal enhancement and contrast excretion within normal limits. Aortoiliac calcified atherosclerosis noted. Major arterial structures are patent.  Portal venous system is patent. Moderately decreased density throughout the liver with irregular liver contour. No discrete liver mass. Moderately distended gallbladder with dependent gallstones. No dilatation of the CBD. No dilated intrahepatic biliary tree.  No lymphadenopathy. Small volume of ascites along the distal thoracic esophagus.  IMPRESSION: 1. Hepatic steatosis and cirrhosis with portal venous hypertension including splenomegaly, varices, ascites. No discrete liver mass is identified. 2. Moderately distended gallbladder with cholelithiasis, but no intra- or extrahepatic biliary ductal dilatation. 3. Reactive appearing small and large bowel wall thickening. 4. Pulmonary atelectasis.   Electronically Signed   By: Odessa Fleming M.D.   On: 12/28/2014 11:23   US Abdomen Limited  12/28/2014   CLINICAL DATA:  Jaundice, right upper quadrant discomfort for the past 3 weeks; history of alcohol abuse, nausea, vomiting, and confusion.  EXAM: US ABDOMEN LIMITED - RIGHT UPPER QUADRANT  COMPARISON:  None  FINDINGS: Gallbladder:  The gallbladder is distended and contains echogenic material likely inspissated bile or sludge. The gallbladder wall is thickened to 7 mm. There is no intramural fluid or positive sonographic Murphy's sign. There is adjacent  ascites.  Common bile duct:  Diameter: 5 mm  Liver:  There is marked attenuation of the ultrasound beam by the hepatic echotexture. There is no discrete mass or ductal dilation but evaluation is limited. No focal lesion identified. Within normal limits in parenchymal echogenicity.  IMPRESSION: 1. The gallbladder is mildly distended and  the lumen largely filled with echogenic material which may reflect sludge or echogenic bile. There is gallbladder wall thickening. No discrete gallbladder mass is demonstrated but an intraluminal mass could mimic the findings here. 2. Increased hepatic echotexture with poor definition of the parenchymal architecture. 3. CT scanning of the abdomen is recommended for further evaluation of the liver, gallbladder, and pancreas.   Electronically Signed   By: Doninique Lwin  [place] M.D.   On: 12/28/2014 09:20   US Paracentesis  01/02/2015   CLINICAL DATA:  Ascites  EXAM: ULTRASOUND GUIDED  PARACENTESIS  COMPARISON:  None.  PROCEDURE: An ultrasound guided paracentesis was thoroughly discussed with the patient and questions answered. The benefits, risks, alternatives and complications were also discussed. The patient understands and wishes to proceed with the procedure. Written consent was obtained.  Ultrasound was performed to localize and mark an adequate pocket of fluid in the right lower quadrant of the abdomen. The area was then prepped and draped in the normal sterile fashion. 1% Lidocaine was used for local anesthesia. Under ultrasound guidance a 19 gauge Yueh catheter was introduced. Paracentesis was performed. The catheter was removed and a dressing applied.  Complications: None.  FINDINGS: A total of approximately 4 L of bright yellow fluid was removed. A fluid sample was not sent for laboratory analysis.  IMPRESSION: Successful ultrasound guided paracentesis yielding 4 L of ascites.   Electronically Signed   By: Charlett Nose M.D.   On: 01/02/2015 16:10     Microbiology: Recent Results (from the past 240 hour(s))  Body fluid culture     Status: None (Preliminary result)   Collection Time: 01/02/15  3:50 PM  Result Value Ref Range Status   Specimen Description ASCITIC FLUID  Final   Special Requests NONE  Final   Gram Stain   Final    RARE WBC PRESENT, PREDOMINANTLY MONONUCLEAR NO ORGANISMS SEEN Performed  at [facility]    Culture   Final    NO GROWTH 1 DAY Performed at [facility]    Report Status PENDING  Incomplete  Anaerobic culture     Status: None (Preliminary result)   Collection Time: 01/02/15  3:50 PM  Result Value Ref Range Status   Specimen Description ASCITIC FLUID  Final   Special Requests NONE  Final   Gram Stain   Final    RARE WBC PRESENT, PREDOMINANTLY MONONUCLEAR NO ORGANISMS SEEN Performed at [facility]    Culture   Final    NO ANAEROBES ISOLATED; CULTURE IN PROGRESS FOR 5 DAYS Performed at [facility]    Report Status PENDING  Incomplete     Labs: Basic Metabolic Panel:  Recent Labs Lab 12/28/14 1414  12/31/14 0636 01/01/15 0610 01/02/15 0601 01/03/15 0603 01/04/15 0621  NA  --   < > 138 145 144 146* 145  K  --   < > 3.8 3.8 3.7 3.0* 3.7  CL  --   < > 107 115* 114* 122* 119*  CO2  --   < > 23 20 22 20 19   GLUCOSE  --   < > 134* 110* 127* 124* 154*  BUN  --   < >  24* 27* 26* 29* 39*  CREATININE  --   < > 0.41* <0.30* <0.30* 0.45* 0.90  CALCIUM  --   < > 8.2* 8.6 8.5 8.1* 8.2*  MG 1.7  --   --   --   --   --   --   < > = values in this interval not displayed. Liver Function Tests:  Recent Labs Lab 12/31/14 0636 01/01/15 0610 01/02/15 0601 01/03/15 0603 01/04/15 0621  AST 131* 175* 205* 187* 171*  ALT 35 52 77* 90* 118*  ALKPHOS 120* 129* 120* 114 130*  BILITOT 27.4* 31.0* 32.0* 29.9* 34.9*  PROT 5.7* 6.1 6.1 5.4* 5.5*  ALBUMIN 1.8* 2.0* 1.9* 1.8* 1.8*   No results for input(s): LIPASE, AMYLASE in the last 168 hours.  Recent Labs Lab 12/29/14 1120 12/30/14 0654 12/31/14 0631 01/02/15 0601 01/04/15 0703  AMMONIA 64* 45* 47* 33* 38*   CBC:  Recent Labs Lab 12/31/14 0636 01/01/15 0610 01/02/15 0601 01/03/15 0603 01/04/15 0621  WBC 13.3* 17.4* 19.2* 19.3* 24.5*  HGB 10.0* 11.2* 11.3* 11.6* 12.5*  HCT 29.0* 33.3* 35.1* 35.8* 38.1*  MCV 111.5* 115.2* 115.5* 116.2* 116.2*  PLT 182 219  203 157 162   Cardiac Enzymes: No results for input(s): CKTOTAL, CKMB, CKMBINDEX, TROPONINI in the last 168 hours. BNP: Invalid input(s): POCBNP CBG: No results for input(s): GLUCAP in the last 168 hours.  Time coordinating discharge:  Greater than 30 minutes  Signed:  Nyron Mozer, DO [facility] Pager: [phone] 01/04/2015, 11:14 AM

## 2015-01-05 NOTE — Care Management Utilization Note (Signed)
UR completed 

## 2015-01-06 LAB — BODY FLUID CULTURE: Culture: NO GROWTH

## 2015-01-07 LAB — ANAEROBIC CULTURE

## 2015-02-08 DEATH — deceased

## 2015-12-15 IMAGING — DX DG CHEST 2V
2 series · 2 of 2 positions shown · non-contrast
Comparison: None.

CLINICAL DATA: 59-year-old male with vomiting and cough for 1 week.
Jaundice. Initial encounter.

EXAM:
CHEST  2 VIEW

[chest pa]
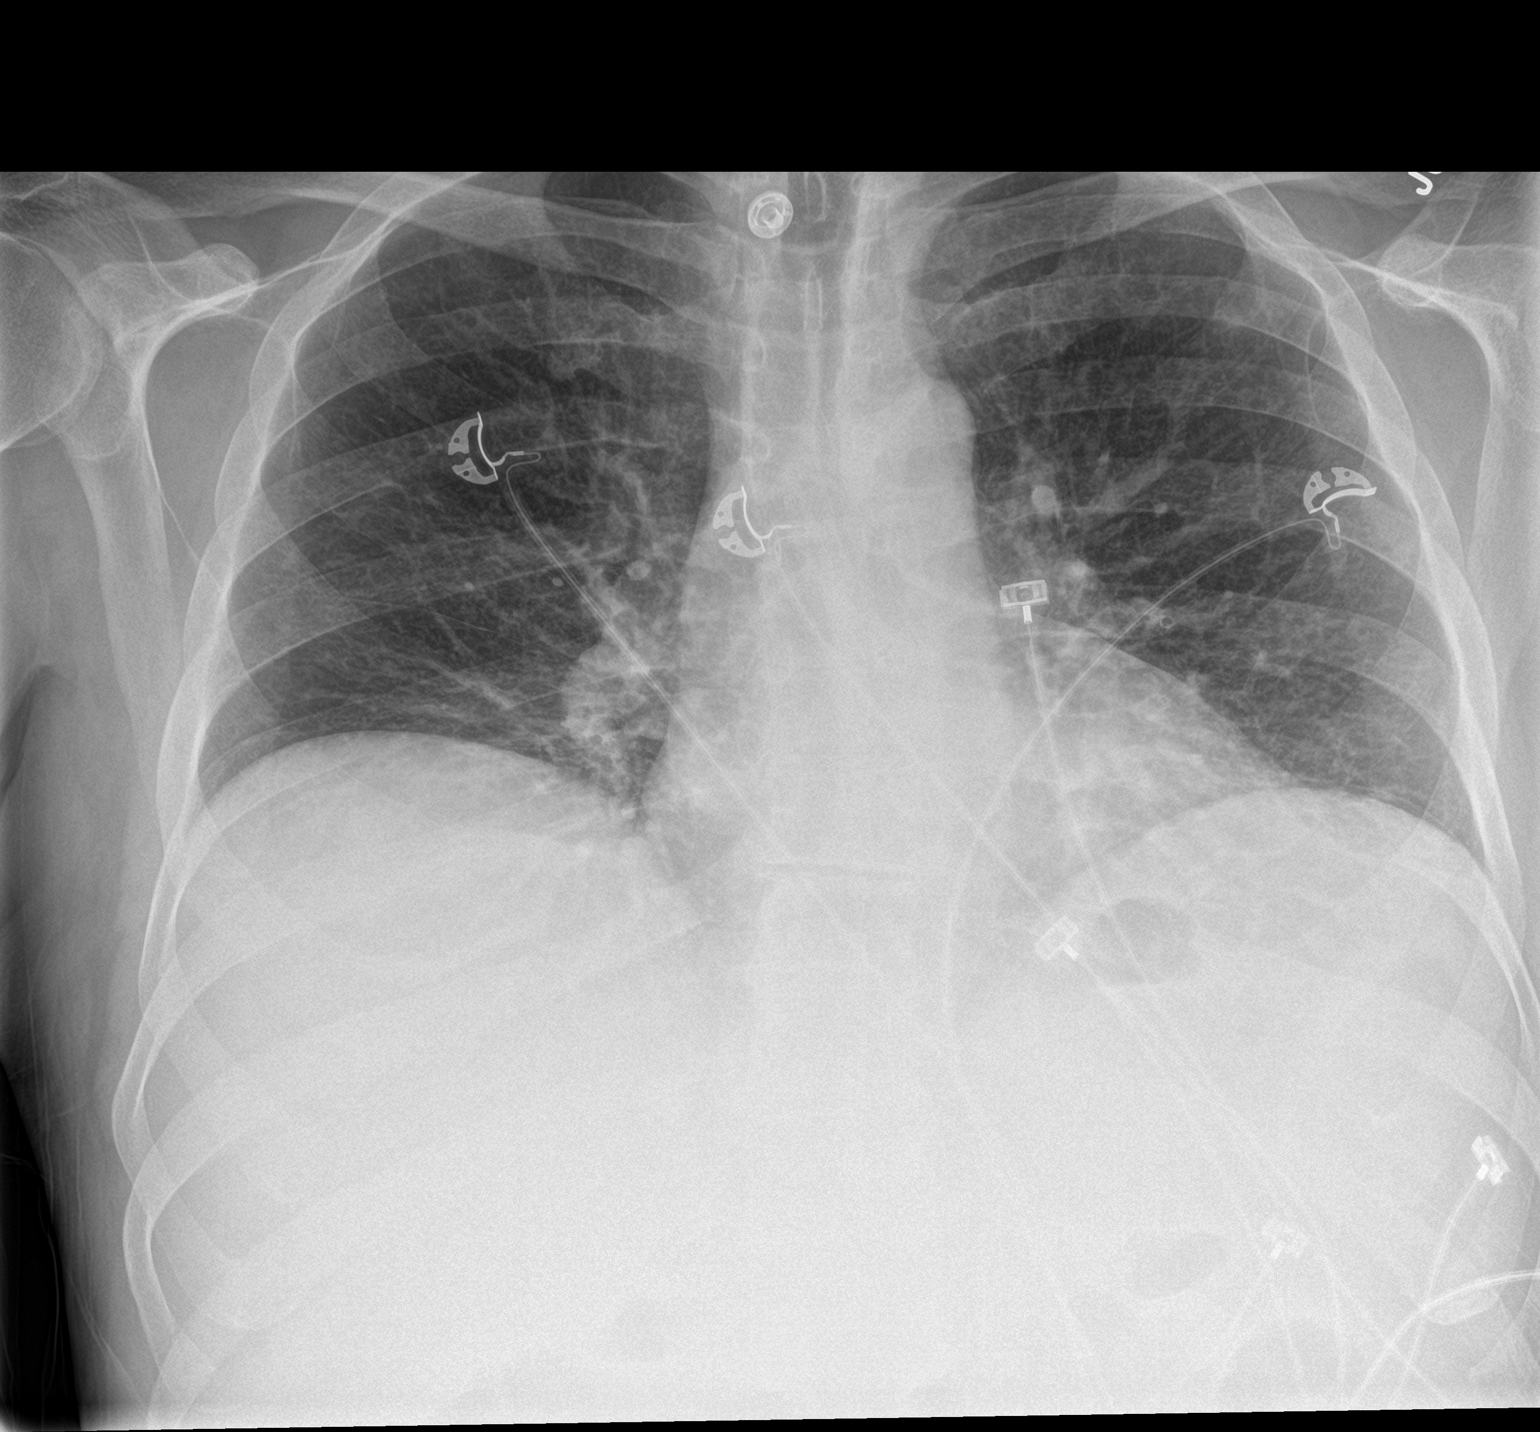

[chest lat]
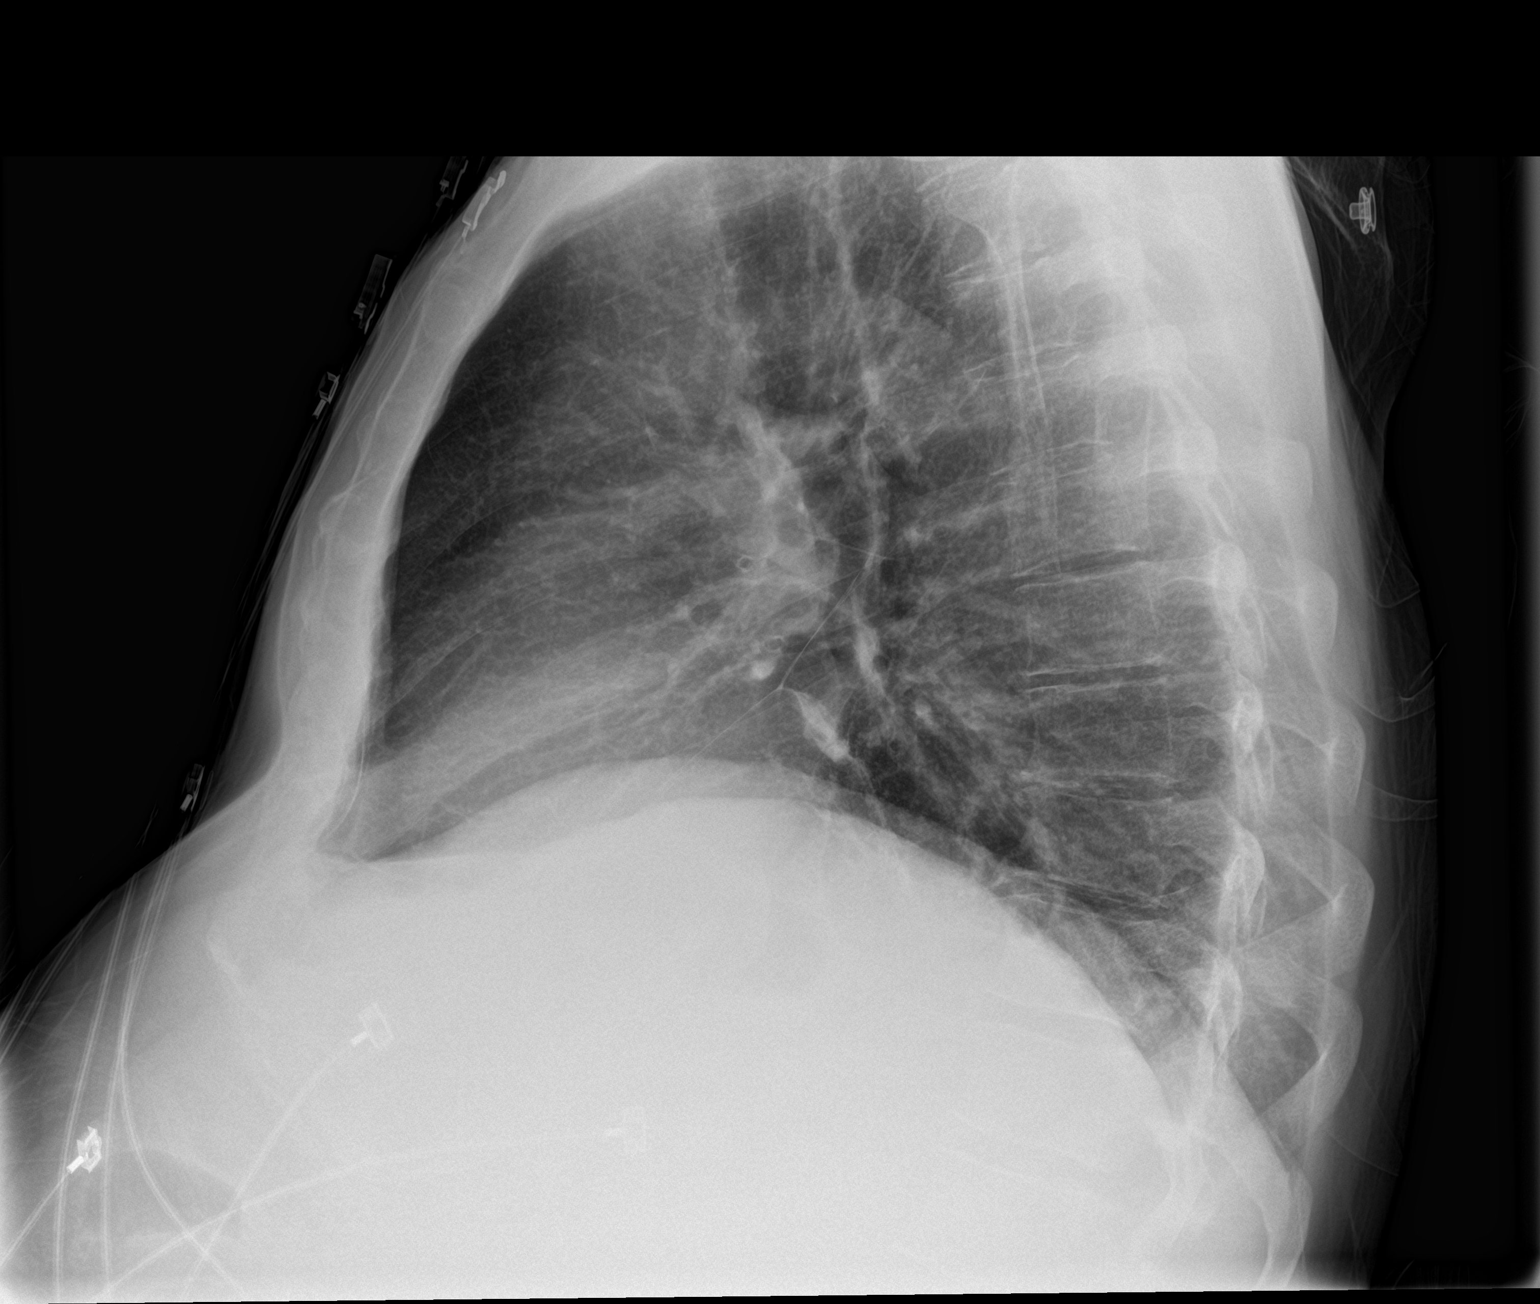

[2 of 2 positions shown; findings below may reference images not displayed]

FINDINGS: Low lung volumes. Mild crowding of markings at both lung bases.
Normal cardiac size and mediastinal contours. Visualized tracheal
air column is within normal limits. No pneumothorax, pulmonary edema
or pleural effusion. No acute osseous abnormality identified.
IMPRESSION: Low lung volumes, otherwise no acute cardiopulmonary abnormality.

## 2015-12-15 IMAGING — CT CT ABD-PELV W/ CM
2 of 5 series · 15 of 46 positions shown, 17 images · IV contrast (Omnipaque 300)
Comparison: Abdomen ultrasound 6501 hours today. Chest radiographs
8412 hours today.

CLINICAL DATA: 59-year-old male with new onset jaundice since last
week. Nausea vomiting and cold like illness symptoms for 3 weeks.
Confusion today. Initial encounter.

EXAM:
CT ABDOMEN AND PELVIS WITH CONTRAST
TECHNIQUE: Multidetector CT imaging of the abdomen and pelvis was performed
using the standard protocol following bolus administration of
intravenous contrast.
CONTRAST:  100mL OMNIPAQUE IOHEXOL 300 MG/ML  SOLN

[Series 2: abd_pel_with 5.0 b40f · axial · 0.80mm/px · z∈[-534,-54]mm · 12 of 110 slices shown, 14 images]
[im 7/110  soft-tissue]
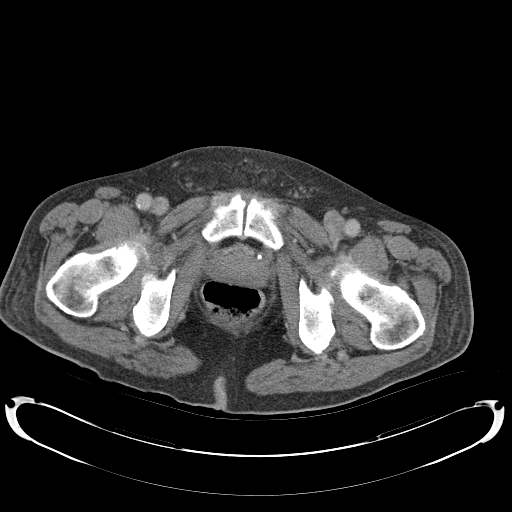
[im 7/110  bone]
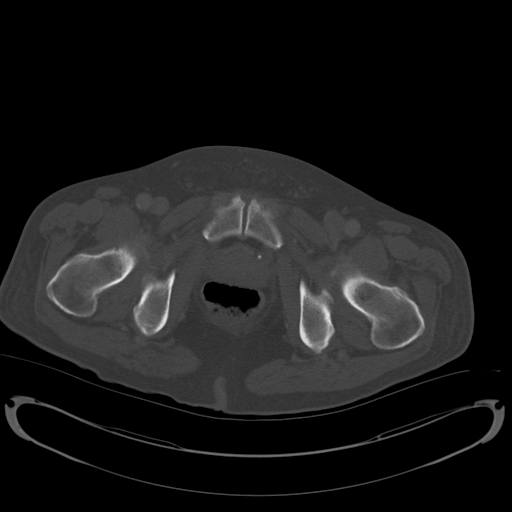
[im 19/110  soft-tissue]
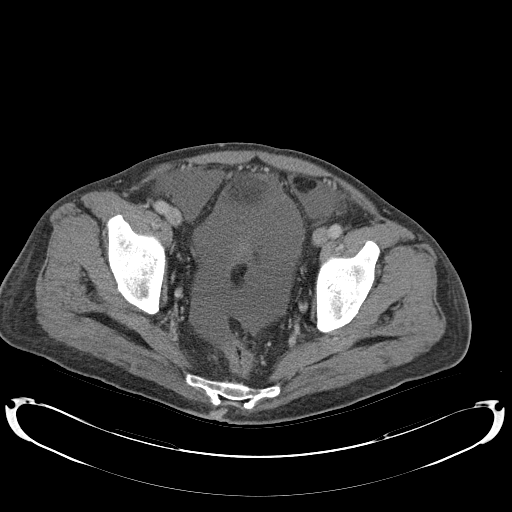
[im 25/110  soft-tissue]
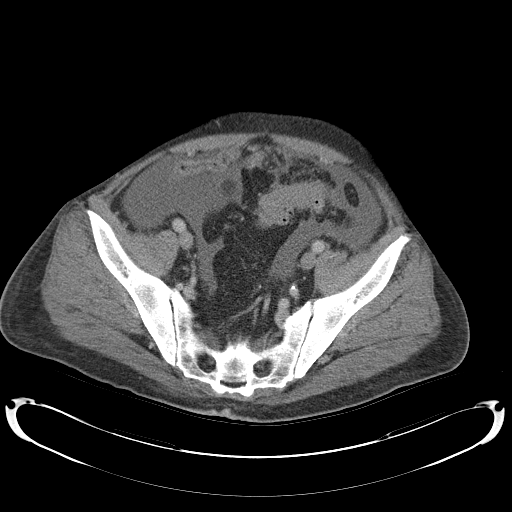
[im 31/110  soft-tissue]
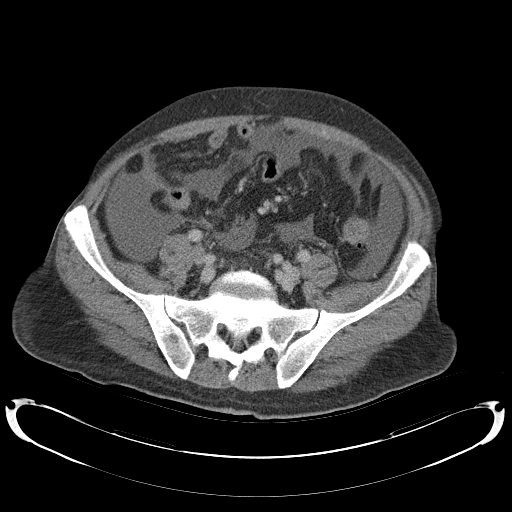
[im 43/110  soft-tissue]
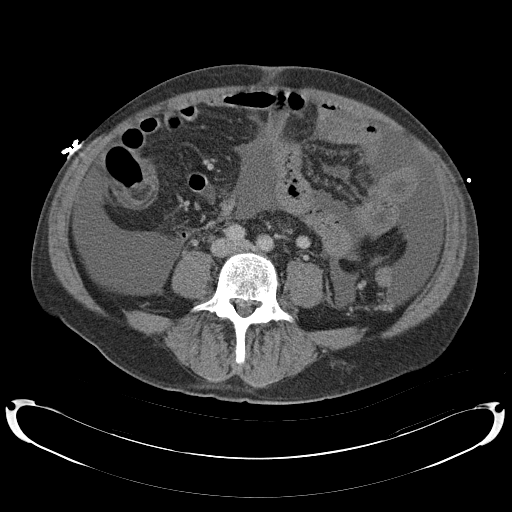
[im 49/110  soft-tissue]
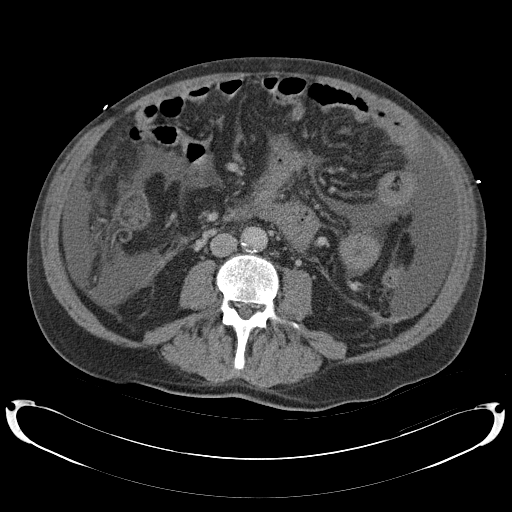
[im 61/110  soft-tissue]
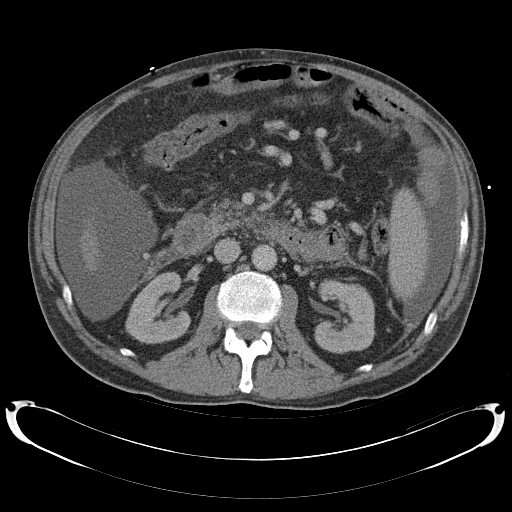
[im 67/110  soft-tissue]
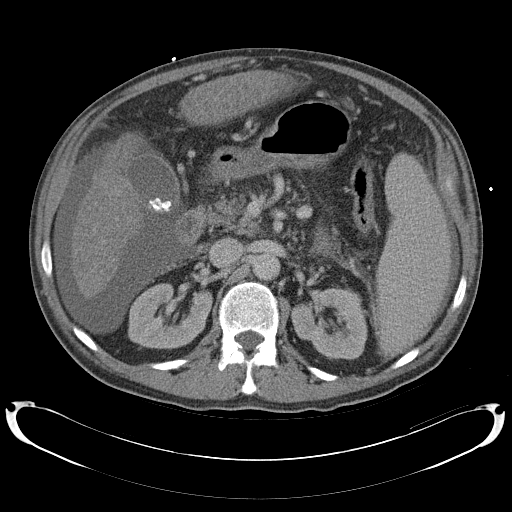
[im 79/110  soft-tissue]
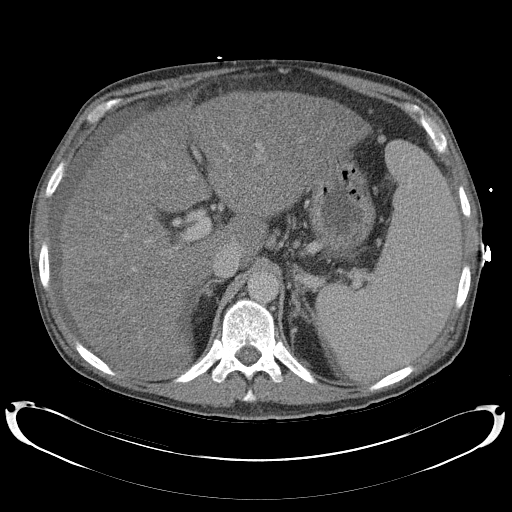
[im 79/110  bone]
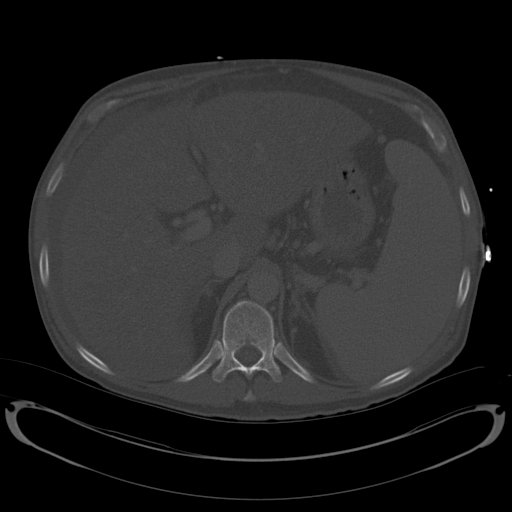
[im 85/110  soft-tissue]
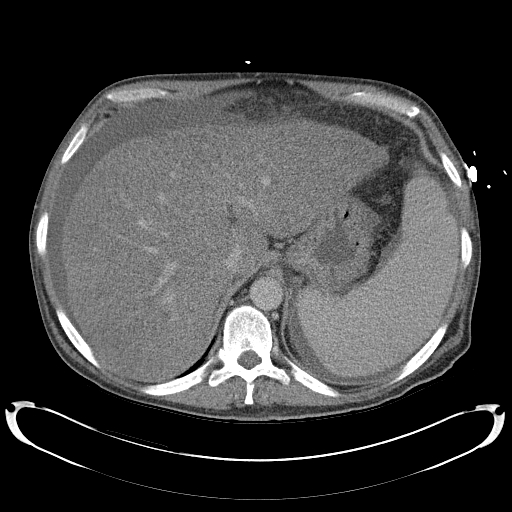
[im 91/110  soft-tissue]
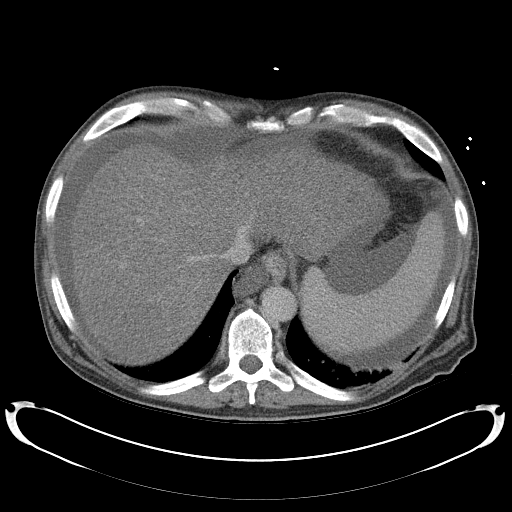
[im 103/110  soft-tissue]
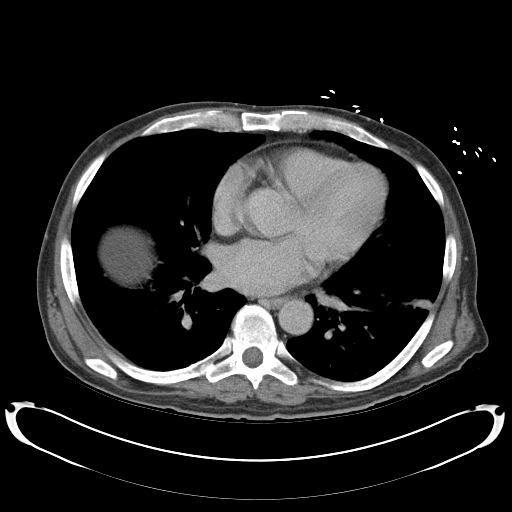

[Series 3: abd_pel_with 3.0 spo cor · coronal · 0.83mm/px · 3 of 95 slices shown]
[im 32/95  soft-tissue]
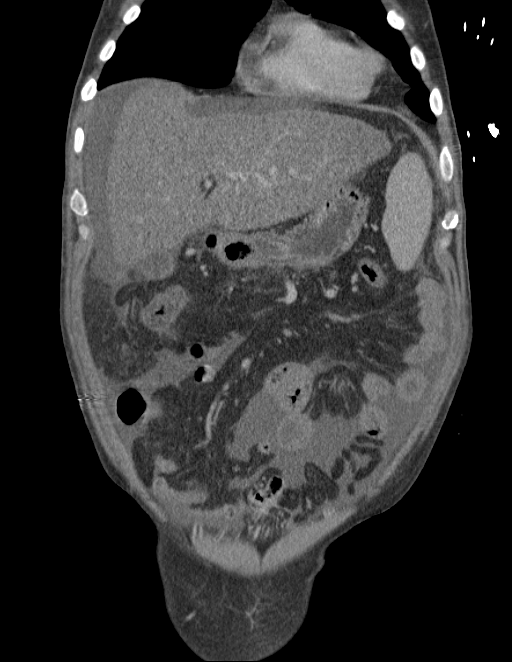
[im 42/95  soft-tissue]
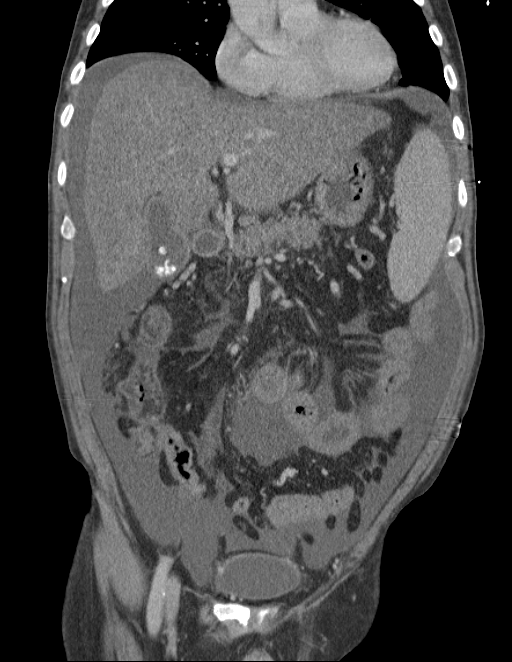
[im 53/95  soft-tissue]
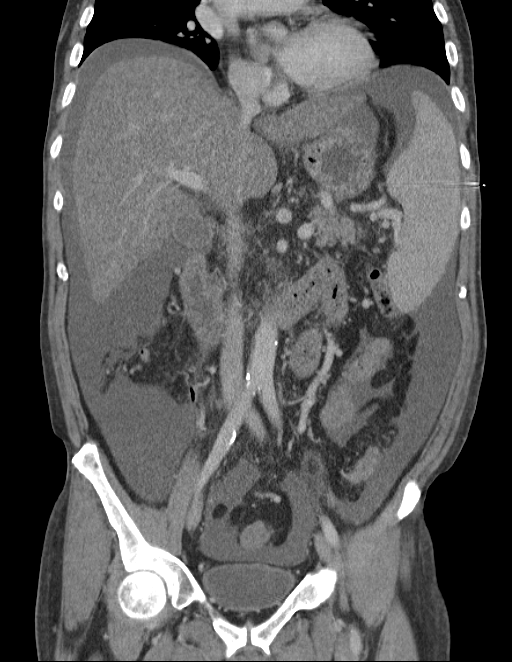

[15 of 46 positions shown; findings below may reference images not displayed]

FINDINGS: Atelectasis at both lung bases. Mostly calcified granuloma in the
right costophrenic angle. No pericardial or pleural effusion.

No acute osseous abnormality identified.

Small to moderate volume of pelvic ascites. Small fat containing
inguinal hernia with trace fluid.

Space of Retzius varices. Otherwise negative bladder. Negative
distal colon.

Sigmoid and left colon diverticulosis. Mild wall thickening of the 8
transverse colon and right colon probably is reactive. Normal
appendix. No dilated small bowel, but there are thickened loops of
small bowel in the left abdomen (series 2, image 73). Negative
stomach. Mild duodenum all thickening and small second portion
duodenum diverticulum on series 2, image 47.

Splenomegaly, splenic volume calculated at 1,206 mL (normal splenic
volume range 83 - 412 mL). Small to medium caliber varices at the
root of the mesentery and tracking within the small bowel mesentery.
Recannulized paraumbilical vein and small caliber are ventral
abdominal wall varices. Small volume abdominal ascites.

Negative pancreas and adrenal glands. Renal enhancement and contrast
excretion within normal limits. Aortoiliac calcified atherosclerosis
noted. Major arterial structures are patent.

Portal venous system is patent. Moderately decreased density
throughout the liver with irregular liver contour. No discrete liver
mass. Moderately distended gallbladder with dependent gallstones. No
dilatation of the CBD. No dilated intrahepatic biliary tree.

No lymphadenopathy. Small volume of ascites along the distal
thoracic esophagus.
IMPRESSION: 1. Hepatic steatosis and cirrhosis with portal venous hypertension
including splenomegaly, varices, ascites. No discrete liver mass is
identified.
2. Moderately distended gallbladder with cholelithiasis, but no
intra- or extrahepatic biliary ductal dilatation.
3. Reactive appearing small and large bowel wall thickening.
4. Pulmonary atelectasis.
